# Patient Record
Sex: Male | Born: 1950
Health system: Southern US, Community
[De-identification: ages and names within clinical notes are randomized; demographics above are authoritative.]

---

## 2007-04-23 ENCOUNTER — Ambulatory Visit (HOSPITAL_COMMUNITY): Admission: RE | Admit: 2007-04-23 | Discharge: 2007-04-23 | Payer: Self-pay | Admitting: *Deleted

## 2007-04-23 ENCOUNTER — Encounter (INDEPENDENT_AMBULATORY_CARE_PROVIDER_SITE_OTHER): Payer: Self-pay | Admitting: *Deleted

## 2009-10-12 ENCOUNTER — Encounter: Admission: RE | Admit: 2009-10-12 | Discharge: 2009-10-12 | Payer: Self-pay | Admitting: Internal Medicine

## 2010-12-21 NOTE — Op Note (Signed)
NAME:  Brandon Jarvis, Brandon Jarvis NO.:  [id number]   MEDICAL RECORD NO.:  [id number]          PATIENT TYPE:  AMB   LOCATION:  ENDO                         FACILITY:  [facility]   PHYSICIAN:  Georgiana Spinner, M.D.    DATE OF BIRTH:  [birth year]   DATE OF PROCEDURE:  04/23/2007  DATE OF DISCHARGE:                               OPERATIVE REPORT   PROCEDURE:  Colonoscopy.   INDICATIONS:  Colon polyps.   ANESTHESIA:  Demerol 75 mg, Versed 7 mg.   DESCRIPTION OF PROCEDURE:  With the patient mildly sedated in the left  lateral decubitus position, the Pentax videoscopic colonoscope was  inserted into the rectum after normal rectal exam, and passed under  direct vision to the cecum, identified by ileocecal valve and base of  cecum both of which were photographed.  From this point the colonoscope  was slowly withdrawn taking circumferential views of colonic mucosa  stopping in the descending colon where a polyp was seen, photographed,  and removed using snare cautery technique at a setting of 20/150 blended  current.   We next stopped a 25 cm from anal verge where two polyps were seen. One  was removed using hot biopsy forceps technique.  The next using snare  cautery technique.  Both at the same setting of 20/150 blended current.  We stopped in the rectum which appeared normal.  On direct it showed  hemorrhoids in retroflex view.  The endoscope was straightened and  withdrawn.  The patient's vital signs and pulse oximeter remained  stable.  The patient tolerated the procedure well without apparent  complications.   FINDINGS:  1. Slightly suboptimal prep in the cecum, brownish material covering      the mucosa which would not wash with water.  2. Polyps of descending colon at 25 cm from the anal verge.  3. Internal hemorrhoids.   PLAN:  Await biopsy report.  The patient will call me for results and  follow up with me as an outpatient.           ______________________________  Georgiana Spinner, M.D.     GMO/MEDQ  D:  04/23/2007  T:  04/23/2007  Job:  [id number]

## 2011-06-10 ENCOUNTER — Encounter (HOSPITAL_COMMUNITY): Payer: Self-pay | Admitting: Pharmacy Technician

## 2011-06-14 ENCOUNTER — Ambulatory Visit (HOSPITAL_COMMUNITY)
Admission: RE | Admit: 2011-06-14 | Discharge: 2011-06-14 | Disposition: A | Discharge status: Home / Self Care | Payer: BC Managed Care – PPO | Source: Ambulatory Visit | Attending: Cardiology | Admitting: Cardiology

## 2011-06-14 ENCOUNTER — Encounter (HOSPITAL_COMMUNITY)
Admission: RE | Disposition: A | Discharge status: Home / Self Care | Payer: Self-pay | Source: Ambulatory Visit | Attending: Cardiology

## 2011-06-14 DIAGNOSIS — I6529 Occlusion and stenosis of unspecified carotid artery: Secondary | ICD-10-CM | POA: Insufficient documentation

## 2011-06-14 DIAGNOSIS — E785 Hyperlipidemia, unspecified: Secondary | ICD-10-CM | POA: Insufficient documentation

## 2011-06-14 DIAGNOSIS — R079 Chest pain, unspecified: Secondary | ICD-10-CM | POA: Insufficient documentation

## 2011-06-14 DIAGNOSIS — R0609 Other forms of dyspnea: Secondary | ICD-10-CM | POA: Insufficient documentation

## 2011-06-14 DIAGNOSIS — R0989 Other specified symptoms and signs involving the circulatory and respiratory systems: Secondary | ICD-10-CM | POA: Insufficient documentation

## 2011-06-14 DIAGNOSIS — I708 Atherosclerosis of other arteries: Secondary | ICD-10-CM | POA: Insufficient documentation

## 2011-06-14 DIAGNOSIS — I70219 Atherosclerosis of native arteries of extremities with intermittent claudication, unspecified extremity: Secondary | ICD-10-CM | POA: Insufficient documentation

## 2011-06-14 HISTORY — PX: LEFT HEART CATHETERIZATION WITH CORONARY ANGIOGRAM: SHX5451

## 2011-06-14 HISTORY — PX: LOWER EXTREMITY ANGIOGRAM: SHX5508

## 2011-06-14 SURGERY — LEFT HEART CATHETERIZATION WITH CORONARY ANGIOGRAM
Anesthesia: LOCAL

## 2011-06-14 MED ORDER — ASPIRIN 81 MG PO CHEW
CHEWABLE_TABLET | ORAL | Status: AC
  Filled 2011-06-14: qty 4

## 2011-06-14 MED ORDER — SODIUM CHLORIDE 0.9 % IV SOLN
INTRAVENOUS | Status: DC
  Administered 2011-06-14: 09:00:00 via INTRAVENOUS

## 2011-06-14 MED ORDER — ONDANSETRON HCL 4 MG/2ML IJ SOLN: 4.0000 mg | Freq: Four times a day (QID) | INTRAMUSCULAR | Status: DC | PRN

## 2011-06-14 MED ORDER — MIDAZOLAM HCL 2 MG/2ML IJ SOLN
INTRAMUSCULAR | Status: AC
  Filled 2011-06-14: qty 2

## 2011-06-14 MED ORDER — HYDROMORPHONE HCL PF 2 MG/ML IJ SOLN
INTRAMUSCULAR | Status: AC
  Filled 2011-06-14: qty 1

## 2011-06-14 MED ORDER — HEPARIN (PORCINE) IN NACL 2-0.9 UNIT/ML-% IJ SOLN
INTRAMUSCULAR | Status: AC
  Filled 2011-06-14: qty 2000

## 2011-06-14 MED ORDER — NITROGLYCERIN 0.2 MG/ML ON CALL CATH LAB
INTRAVENOUS | Status: AC
  Filled 2011-06-14: qty 1

## 2011-06-14 MED ORDER — ACETAMINOPHEN 325 MG PO TABS: 650.0000 mg | ORAL_TABLET | ORAL | Status: DC | PRN

## 2011-06-14 MED ORDER — SODIUM CHLORIDE 0.9 % IV SOLN: 1.0000 mL/kg/h | INTRAVENOUS | Status: DC

## 2011-06-14 MED ORDER — ASPIRIN 81 MG PO CHEW
324.0000 mg | CHEWABLE_TABLET | ORAL | Status: DC
  Administered 2011-06-14: 324 mg via ORAL

## 2011-06-14 NOTE — Op Note (Signed)
Dictated

## 2011-06-14 NOTE — H&P (Signed)
EVAL to f/u gxt per JG. dks (Dr Lavone Orn)  (Appt time: 11:00 AM) (Arrival time: 10:59 AM)  HOPI: Patient presents to me for followup and evaluation of an abnormal MRI that was done in March of 2011.  This was done for abdominal discomfort and nephrology assess.  At that time she was found to have significant atherosclerotic changes of the abdominal aorta. Patient was referred to [place] for a routine treadmill exercise stress test as a part of screening because of his abdominal aortic atherosclerosis.  During the stress test patient had submaximal exercise and had to be stopped due to what appeared to be symptoms of claudication in bilateral calf left worse than the right.  He also had mild dyspnea and very mild chest discomfort.  The EKG changes for past 2 to suggest myocardial ischemia.  He is now presents to the office for further followup and evaluation. Patient has been walking on a daily basis.  He walks about in her from one to 2 miles a day.  He states that he does not walk on an incline as on a treadmill.  At times he notices his hips both right and left bothersome left more than the right and also his calf do bother him and sometimes he does slow down but overall he states that he has remained fairly active in the last several years.  He occasionally when he does more than his usual amount of work does feel a little short of breath and also notices a little chest discomfort.  Otherwise he states he's feeling well.  He denies PND orthopnea.  Denies any palpitations.  He denies any syncope.  He denies neurological deficits.   ROS: Arthritis-no;  Cramping of the legs and feet at night or with activity-yes, with a lot of activity; Diabetes-no;  Hypothyroidism-no;  Previous Stroke-no, Previous GI Bleed-no ,  Recent weight change-no . Other systems negative.   Past Medical History (Major events, hospitalizations, surgeries):  Septasemia from a cat bite 1992. Treadmill stress test 06/07/11. MRI abdomen/chest  3/11.  Known allergies: NKDA.     Ongoing medical problems: Gum disease. Bursitis in elbows. Atherosclerosis.    Family medical history: Mother died at age 82 from septic pneumonia; h/o arrythmia. Father died at age 72 from Parkinson's/dementia; h/o triple bypass in late 36's. 2 sisters-Sister died at age 61 from colon cancer; Living sister is healthy.    Preventative: Colonoscopy 3/11 (normal). No endoscopy.    Social history: No smoking-quit 14 months ago. No alcohol. Married. No children. Poly substance abuse and clean for 17 years.  Height 72 ", weight 210 Lbs, BMI 28.5, BP 120/84, Pulse 53/min.  Physical exam Well build and overweight body habitus who is  in no acute distress. Appears stated age. Alert Ox3.  There is no cyanosis.  NECK: no JVD noted.  Heart: S1 S2 Normal, S4 gallop present. No murmur.  CHEST EXAM: No tenderness of chest wall.  LUNGS: Clear to percuss and auscultate. No rales or ronchi.  ABDOMEN: No hepatosplenomegaly.  BS normal in all 4 quadrants. Abdomen is non-tender.  MUSCULOSKELETAL EXAM: Intact with full range of motion in all 4 extremities.  NEUROLOGIC EXAM: Grossly intact without any focal deficits. Alert O x 3.   EXTREMITY: No edema. Normal pulses.  VASCULAR EXAM: No skin breakdown. Carotids normal. Extremities: Femoral pulse left femoral bruit present 1-2 plus pulse, right normal. Popliteal pulse Normal right and left ; Pedal pulse normal. No prominent pulse felt in the abdomen.  No varicose veins.  Assessment: 1.  PAD/Claudication:.   At times he notices his hips both right and left bothersome left more than the right. Also symptoms of claudication in bilateral calf left worse than the right.  2. Chest pain suggestive of angina pectoris. Chest pain similar to the discomfort prior to coronary revascularization.    Patient had a treadmill exercise stress test in which he suddenly stopped due to hip and calf pain, shortness of breath and mild chest discomfort. Stress EKG  06/07/11: Positive for ischemia. Dyspnea, chest pain, claudication right calf. Submaximal heart rate due to patient stopping due to claudicaiton. 3.  Shortness of breath/Dyspnea on exertion. Due to mild COPD.  Anginal equivalent  cannot be excluded.   Echo: 06/08/11: Left ventricular cavity is normal in size.   Normal diastolic filling.  Normal systolic global function. EF 55%. Right atrial cavity is normal in size.   Mild aneurysmal motion of the interatrial septum.  There is a prominent Chiari network (normal variant). 4. Poly substance abuse and clean for 17 years.  5. Hyperlipidemia mild.    Labsa 05/11/11 CBC was within normal limits except for mild thrombocytopenia with platelet count of 118,000.  Electrolytes were within normal limits with bun 16, S creatinine 0.9.  Liver enzymes were within normal limits. Total cholesterol is 214, triglycerides 130, HDL 62, LDL 126 and non-HDL cholesterol is 152.  DIAGNOSES:       Other nonspecific abnormal function study of cardiovascular system [794.39]       Shortness of breath [786.05]       Precordial pain [786.51]       Atherosclerosis of native arteries of the extremities with intermittent claudication [440.21]       Pure hypercholesterolemia [272.0]  Plan:  Mechanism of underlying disease process and action of medications discussed with the patient.   I discussed primary/secondary prevention and also dietary counceling was done. Continue current medications unchanged.  I have extensive discussion with the patient regarding medical therapy for a abnormal stress test and symptoms that suggest both angina pectoris and probable claudication.  His lipid profile and labs were also discussed with the patient. Schedule for cardiac catheterization. We discussed regarding risks, benefits, alternatives to this including stress testing, CTA and continued medical therapy. Patient wants to proceed. Understands <1-2% risk of death, stroke, MI, urgent CABG, bleeding,  infection, renal failure but not limited to these.   I will also perform abdominal aortogram to evaluate specifically for left   iliac artery stenosis. If present will probably proceed with PTA and angioplasty of the same unless CAD is significant. Patient understands the risks, benefits as stated above for this. Return visit: I will make further recommendations after the test results are known.  REFERRALS:       Juline Patch (Internal Medicine)  Fax: [phone]  Phone: [phone]  MEDICATIONS:       Doxycycline Hyclate (doxycycline) oral delayed release tablet hyclate 100 mg Take 1/4 tab daily                   prescription:   not prescribed this visit       Aspirin oral tablet 81 mg once a day                   prescription:   not prescribed this visit       Fish oil  1200 mg- 2 caps daily  prescription:   not prescribed this visit       Lysine oral tablet 1000 mg once a day                   prescription:   not prescribed this visit       Coenzyme Co Q10 (ubiquinone) oral capsule 100 mg once a day                   prescription:   not prescribed this visit       ICaps with Lutein and Zeaxan (multivitamin with minerals) oral tablet Antioxidant Multiple Vitamins and Minerals once a day                   prescription:   not prescribed this visit       Multivitamin oral tablet Multiple Vitamins once a day                   prescription:   not prescribed this visit

## 2011-06-14 NOTE — Brief Op Note (Addendum)
06/14/2011  9:52 AM  PATIENT:  Brandon Jarvis  60 y.o. male  PRE-OPERATIVE DIAGNOSIS:  chest pain  POST-OPERATIVE DIAGNOSIS:  * PAD *  PROCEDURE:  Procedure(s): LEFT HEART CATHETERIZATION WITH CORONARY ANGIOGRAM ABDOMINAL ANGIOGRAM  SURGEON:  Surgeon(s): Pamella Pert  PHYSICIAN ASSISTANT:   ASSISTANTS: none   ANESTHESIA:   IV sedation  EBL:     BLOOD ADMINISTERED:none  DRAINS: none   LOCAL MEDICATIONS USED:  LIDOCAINE 10CC  SPECIMEN:  No Specimen  DISPOSITION OF SPECIMEN:  N/A  COUNTS:  YES  TOURNIQUET:  * No tourniquets in log *  DICTATION: .Other Dictation: Dictation Number [id number], [id number]  PLAN OF CARE: discharge home after bedrest  PATIENT DISPOSITION:  Short Stay   Delay start of Pharmacological VTE agent (>24hrs) due to surgical blood loss or risk of bleeding:  not applicable        Diagnostic left heart cath: Mild calcific plaque in LM, otherwise normal. Left Internal iliac artery stenosis of 90%. Mild 30% EIA stenosis. Medical therapy

## 2011-06-14 NOTE — Cardiovascular Report (Signed)
NAME:  Brandon Jarvis, Brandon Jarvis NO.:  [id number]  MEDICAL RECORD NO.:  [id number]  LOCATION:  [facility]                         FACILITY:  [facility]  PHYSICIAN:  Pamella Pert, MD DATE OF BIRTH:  1951-05-01  DATE OF PROCEDURE:  06/14/2011 DATE OF DISCHARGE:                           CARDIAC CATHETERIZATION   ADDENDUM:  TECHNIQUE OF PROCEDURE:  Under sterile precautions using a 6-French right femoral artery access, 6-French multipurpose B2 catheter was advanced into the right then into the left ventricle.  Left ventriculography was performed both in LAO and RAO projection.  Catheter pulled into the ascending aorta.  Right coronary was selectively engaged and angiography was performed.  Then the left main coronary artery was selectively engaged and angiography was performed.  Catheter was then pulled back into the abdominal aorta and abdominal aortogram was performed.  Left iliac arteriogram with distal runoff:  Left iliac artery with distal runoff was performed using a 5-French crossover catheter and left iliac artery was selectively cannulated and angiography was performed. The catheter was then pulled out of body in the usual fashion.  The patient tolerated the procedure.  No immediate complication noted. Hemostasis was obtained by applying manual pressure.     Pamella Pert, MD     JRG/MEDQ  D:  06/14/2011  T:  06/14/2011  Job:  [id number]

## 2011-06-14 NOTE — Cardiovascular Report (Signed)
NAME:  Brandon Jarvis, Brandon Jarvis NO.:  [id number]  MEDICAL RECORD NO.:  [id number]  LOCATION:  [facility]                         FACILITY:  [facility]  PHYSICIAN:  Pamella Pert, MD DATE OF BIRTH:  05/29/1951  DATE OF PROCEDURE: DATE OF DISCHARGE:                           CARDIAC CATHETERIZATION   PROCEDURE PERFORMED: 1. Left ventriculography. 2. Selective right and left coronary angiography. 3. Abdominal aortogram. 4. Crossover into the left iliac artery and left iliac arteriogram. 5. Left iliac arteriogram with femoral runoff.  INDICATION:  Brandon Jarvis is a pleasant 60 year old gentleman who has history of prior tobacco use, polysubstance abuse, who presents for evaluation of abnormal MRI that had revealed significant abdominal aortic atherosclerosis.  He underwent a treadmill exercise stress test which was abnormal and positive for myocardial ischemia with associated shortness of breath and chest discomfort.  Given this, he was brought to the cardiac cath lab to evaluate his coronary anatomy.  Because of abnormal physical examination including bruit and left thigh claudication, he was also brought for peripheral angiography with an eye towards angioplasty.  HEMODYNAMIC DATA:  The left ventricular pressure was 114/4 with end- diastolic of 9 mmHg.  Aortic pressure was 118/68 with a mean of 91 mmHg. There is no pressure gradient across the aortic valve.  ANGIOGRAPHIC DATA:  Left ventricle.  Left ventricular systolic function was lower limit of normal with ejection fraction of 50-55%.  There was no significant mitral regurgitation.  Right coronary artery.  Right coronary artery is large caliber vessel. Smooth and normal.  It is dominant.  Left main coronary artery.  Left main coronary artery shows mild calcification but no luminal irregularity.  LAD.  LAD is a large caliber vessel.  It is smooth and normal.  Left circumflex.  The circumflex is large caliber  vessel.  It is smooth and normal.  Abdominal aortogram.  Abdominal aortogram revealed presence of 2 renal arteries, 1 on either side.  They are widely patent.  Left renal artery appeared to have stenosis.  Selective left renal arteriography. Selective left renal arteriography revealed widely patent left renal artery.  Abdominal aortogram and left iliac arteriogram with distal runoff. Distal abdominal aortogram with aortoiliac bifurcation to be widely patent.  Left external iliac artery showed 50% stenosis.  Selective left iliac arteriogram with femoral runoff.  Selective left iliac arteriogram with femoral runoff revealed left internal iliac artery to have a 90% stenosis.  Left external iliac artery after the bifurcation of the internal iliac artery showed a 30-40% stenosis.  The left superficial femoral artery and below the knee, there was 3- vessel runoff was evident without any significant peripheral arterial disease.  IMPRESSION: 1. No significant coronary arteries by cardiac catheterization.  Mild     calcification is evident in the left main. 2. Abdominal aortogram revealed no evidence of abdominal aortic     aneurysm or abdominal aortic stenosis. 3. Left internal iliac artery stenosis of 90% and left external iliac     stenosis of 30%.  Otherwise no significant peripheral artery     disease.  RECOMMENDATION:  Continued medical therapy is indicated with risk modification.  The patient will be discharged home today with  outpatient followup.     Pamella Pert, MD     JRG/MEDQ  D:  06/14/2011  T:  06/14/2011  Job:  [id number]  cc:   Juanda Crumble

## 2011-06-17 ENCOUNTER — Encounter (HOSPITAL_COMMUNITY): Payer: Self-pay

## 2014-07-16 ENCOUNTER — Encounter (HOSPITAL_COMMUNITY): Payer: Self-pay | Admitting: Cardiology

## 2015-02-23 ENCOUNTER — Other Ambulatory Visit: Payer: Self-pay | Admitting: Gastroenterology

## 2015-06-08 ENCOUNTER — Other Ambulatory Visit: Payer: Self-pay | Admitting: Internal Medicine

## 2015-06-08 DIAGNOSIS — Z87891 Personal history of nicotine dependence: Secondary | ICD-10-CM

## 2015-06-12 ENCOUNTER — Ambulatory Visit
Admission: RE | Admit: 2015-06-12 | Discharge: 2015-06-12 | Disposition: A | Discharge status: Home / Self Care | Payer: BLUE CROSS/BLUE SHIELD | Source: Ambulatory Visit | Attending: Internal Medicine | Admitting: Internal Medicine

## 2015-06-12 DIAGNOSIS — Z87891 Personal history of nicotine dependence: Secondary | ICD-10-CM

## 2015-07-13 ENCOUNTER — Other Ambulatory Visit: Payer: Self-pay | Admitting: Internal Medicine

## 2015-07-13 DIAGNOSIS — E278 Other specified disorders of adrenal gland: Secondary | ICD-10-CM

## 2015-07-13 DIAGNOSIS — E279 Disorder of adrenal gland, unspecified: Principal | ICD-10-CM

## 2015-07-17 ENCOUNTER — Ambulatory Visit
Admission: RE | Admit: 2015-07-17 | Discharge: 2015-07-17 | Disposition: A | Discharge status: Home / Self Care | Payer: BLUE CROSS/BLUE SHIELD | Source: Ambulatory Visit | Attending: Internal Medicine | Admitting: Internal Medicine

## 2015-07-17 DIAGNOSIS — E279 Disorder of adrenal gland, unspecified: Principal | ICD-10-CM

## 2015-07-17 DIAGNOSIS — E278 Other specified disorders of adrenal gland: Secondary | ICD-10-CM

## 2015-08-18 ENCOUNTER — Encounter: Payer: Self-pay | Admitting: Diagnostic Neuroimaging

## 2015-08-18 ENCOUNTER — Ambulatory Visit (INDEPENDENT_AMBULATORY_CARE_PROVIDER_SITE_OTHER): Payer: BLUE CROSS/BLUE SHIELD | Admitting: Diagnostic Neuroimaging

## 2015-08-18 VITALS — BP 142/81 | HR 55 | Ht 72.0 in | Wt 207.0 lb

## 2015-08-18 DIAGNOSIS — M62838 Other muscle spasm: Secondary | ICD-10-CM | POA: Diagnosis not present

## 2015-08-18 DIAGNOSIS — R413 Other amnesia: Secondary | ICD-10-CM

## 2015-08-18 DIAGNOSIS — M791 Myalgia, unspecified site: Secondary | ICD-10-CM

## 2015-08-18 DIAGNOSIS — M792 Neuralgia and neuritis, unspecified: Secondary | ICD-10-CM | POA: Diagnosis not present

## 2015-08-18 NOTE — Progress Notes (Signed)
[facility]  PATIENT: Brandon Jarvis DOB: 02-25-1951  REFERRING CLINICIAN: Barbette Hair HISTORY FROM: patient  REASON FOR VISIT: new consult    HISTORICAL  CHIEF COMPLAINT:  Chief Complaint  Patient presents with  . Pain    rm 7, New Pt, muscle/joint aches, foggy head since 03/2015    HISTORY OF PRESENT ILLNESS:   65 year old right-handed male here for evaluation of constellation of symptoms including muscle aches and pain, joint pains, shooting pains, foggy headedness, easy bruising, decreased coordination since August 2016.  Patient has had some mild intermittent joint pains throughout his adult life but nothing severe enough to warrant medical attention. August 2016 he had an increase in muscle aches, burning sensation, joint pains, joint aches, which seem to affect right or left side, arms or legs, in a random fashion. Symptoms occur almost on a daily basis now. When he is active and doing work, this seems to distract him from his symptoms and he does not feel significant problems. When he is at rest he notices the symptoms more. Activity does not seem to aggravate the problem.  In addition patient having some intermittent electrical shooting pains in his feet, calves, legs.  In addition patient also having a "foggy headed sensation" with difficulty with concentration, short-term memory and attention. Patient is particularly concerned given his family history of Parkinson's disease and dementia in his father.  No specific triggering or aggravating factors. No recent traumas, accidents, injuries. No change in diet, exercise, activity, stress.  Patient has tried hydrocodone and morphine from his wife's supply to see if this would help and it does not given him any benefit in symptom control.  Patient is seen PCP, rheumatology, had extensive blood work testing without specific diagnosis.   REVIEW OF SYSTEMS: Full 14 system review of systems performed and notable  only for joint pain aching muscles and microhematuria. He sleeps 6-7 hours at night. Denies any snoring or breathing problems.   ALLERGIES: No Known Allergies  HOME MEDICATIONS: Outpatient Prescriptions Prior to Visit  Medication Sig Dispense Refill  . aspirin EC 81 MG tablet Take 81 mg by mouth daily. Reported on 08/18/2015    . CO ENZYME Q-10 PO Take 1 capsule by mouth daily. Reported on 08/18/2015    . doxycycline (VIBRA-TABS) 100 MG tablet Take 25 mg by mouth daily. Reported on 08/18/2015    . LUTEIN-ZEAXANTHIN PO Take 1 tablet by mouth daily. Reported on 08/18/2015    . Multiple Vitamins-Minerals (MULTIVITAMINS THER. W/MINERALS) TABS Take 1 tablet by mouth daily. Reported on 08/18/2015    . Omega-3 Fatty Acids (FISH OIL) 1200 MG CAPS Take 2 capsules by mouth daily. Reported on 08/18/2015     No facility-administered medications prior to visit.    PAST MEDICAL HISTORY: History reviewed. No pertinent past medical history.  PAST SURGICAL HISTORY: Past Surgical History  Procedure Laterality Date  . Left heart catheterization with coronary angiogram N/A 06/14/2011    Procedure: LEFT HEART CATHETERIZATION WITH CORONARY ANGIOGRAM;  Surgeon: Laverda Page, MD;  Location: [facility] CATH LAB;  Service: Cardiovascular;  Laterality: N/A;  . Lower extremity angiogram Left 06/14/2011    Procedure: LOWER EXTREMITY ANGIOGRAM;  Surgeon: Laverda Page, MD;  Location: [facility] CATH LAB;  Service: Cardiovascular;  Laterality: Left;    FAMILY HISTORY: History reviewed. No pertinent family history.  SOCIAL HISTORY:  Social History   Social History  . Marital Status: Married    Spouse Name: N/A  . Number of Children:  0  . Years of Education: 66   Occupational History  .      retired, Engineer, production   Social History Main Topics  . Smoking status: Former Smoker    Quit date: 08/17/2009  . Smokeless tobacco: Not on file  . Alcohol Use: No     Comment: quit 1997  . Drug Use: No      Comment: quit 1997  . Sexual Activity: Not on file   Other Topics Concern  . Not on file   Social History Narrative   Lives at home with spouse   Caffeine use- coffee 3 cups daily     PHYSICAL EXAM  GENERAL EXAM/CONSTITUTIONAL: Vitals:  Filed Vitals:   08/18/15 1123  BP: 142/81  Pulse: 55  Height: 6' (1.829 m)  Weight: 207 lb (93.895 kg)     Body mass index is 28.07 kg/(m^2).  Visual Acuity Screening   Right eye Left eye Both eyes  Without correction:     With correction: 20/30 20/30      Patient is in no distress; well developed, nourished and groomed; neck is supple  CARDIOVASCULAR:  Examination of carotid arteries is normal; no carotid bruits  Regular rate and rhythm, no murmurs  Examination of peripheral vascular system by observation and palpation is normal  EYES:  Ophthalmoscopic exam of optic discs and posterior segments is normal; no papilledema or hemorrhages  MUSCULOSKELETAL:  Gait, strength, tone, movements noted in Neurologic exam below  NEUROLOGIC: MENTAL STATUS:  No flowsheet data found.  awake, alert, oriented to person, place and time  recent and remote memory intact  normal attention and concentration  language fluent, comprehension intact, naming intact,   fund of knowledge appropriate  CRANIAL NERVE:   2nd - no papilledema on fundoscopic exam  2nd, 3rd, 4th, 6th - pupils equal and reactive to light, visual fields full to confrontation, extraocular muscles intact, no nystagmus  5th - facial sensation symmetric  7th - facial strength symmetric  8th - hearing intact  9th - palate elevates symmetrically, uvula midline  11th - shoulder shrug symmetric  12th - tongue protrusion midline  MOTOR:   normal bulk and tone, full strength in the BUE, BLE  SENSORY:   normal and symmetric to light touch, temperature, vibration; DECR LEFT FOOT SENSATION TO ALL MODALITIES  COORDINATION:   finger-nose-finger, fine finger  movements normal  REFLEXES:   deep tendon reflexes TRACE and symmetric  GAIT/STATION:   narrow based gait; able to walk tandem; romberg is negative    DIAGNOSTIC DATA (LABS, IMAGING, TESTING) - I reviewed patient records, labs, notes, testing and imaging myself where available.  No results found for: WBC, HGB, HCT, MCV, PLT No results found for: NA, K, CL, CO2, GLUCOSE, BUN, CREATININE, CALCIUM, PROT, ALBUMIN, AST, ALT, ALKPHOS, BILITOT, GFRNONAA, GFRAA No results found for: CHOL, HDL, LDLCALC, LDLDIRECT, TRIG, CHOLHDL No results found for: HGBA1C No results found for: VITAMINB12 No results found for: TSH  07/17/15 CT abdomen  1. 15 mm left adrenal nodule is stable from 2011 consistent with benign process such as adenoma. 2. Left nephrolithiasis.  06/12/15 CT chest  - Lung-RADS Category 2, benign appearance or behavior. Continue annual screening with low-dose chest CT without contrast in 12 months. - Coronary artery atherosclerosis. - Emphysema.    ASSESSMENT AND PLAN  65 y.o. year old male here with constellation of symptoms including muscle aches and pain, joint pains, shooting pains, foggy headedness, easy bruising, decreased coordination since August 2016.  Overall suspect peripheral etiology of muscle aches and shooting pain, such as neuropathy or myopathy/myalgia. His memory problems may be secondary to his pain issues. We'll start with EMG nerve conduction study. Then may pursue additional testing based on those results.  After neuromuscular evaluation, we may consider MRI brain and sleep study for further evaluation of his memory difficulties.  We considered medication management of his symptoms but he would like to decline at this time.   Ddx: neuropathy, myopathy, myalgia, musculoskeletal  Muscle spasm  Muscle ache  Neuropathic pain  Memory loss    PLAN: - EMG/NCS; then may consider add'l labs and muscle/nerve biopsy - then may consider MRI brain and  sleep study for memory evaluation - may consider gabapentin, NSAIDs, muscle relaxers in future for symptom control  Orders Placed This Encounter  Procedures  . NCV with EMG(electromyography)   Return in about 6 weeks (around 09/29/2015).    Penni Bombard, MD [id number], [id number] AM Certified in Neurology, Neurophysiology and Neuroimaging  [facility] [address] [place], [place] [address] [phone]

## 2015-08-18 NOTE — Patient Instructions (Signed)
Thank you for coming to see [place] at [facility]. I hope we have been able to provide you high quality care today.  You may receive a patient satisfaction survey over the next few weeks. We would appreciate your feedback and comments so that we may continue to improve ourselves and the health of our patients.  - I will check electrical nerve testing (EMG/NCS)   ~~~~~~~~~~~~~~~~~~~~~~~~~~~~~~~~~~~~~~~~~~~~~~~~~~~~~~~~~~~~~~~~~  DR. Telisa Ohlsen'S GUIDE TO HAPPY AND HEALTHY LIVING These are some of my general health and wellness recommendations. Some of them may apply to you better than others. Please use common sense as you try these suggestions and feel free to ask me any questions.   ACTIVITY/FITNESS Mental, social, emotional and physical stimulation are very important for brain and body health. Try learning a new activity (arts, music, language, sports, games).  Keep moving your body to the best of your abilities. You can do this at home, inside or outside, the park, community center, gym or anywhere you like. Consider a physical therapist or personal trainer to get started. Consider the app Sworkit. Fitness trackers such as smart-watches, smart-phones or Fitbits can help as well.   NUTRITION Eat more plants: colorful vegetables, nuts, seeds and berries.  Eat less sugar, salt, preservatives and processed foods.  Avoid toxins such as cigarettes and alcohol.  Drink water when you are thirsty. Warm water with a slice of lemon is an excellent morning drink to start the day.  Consider these websites for more information The Nutrition Source (https://www.henry-hernandez.biz/) Precision Nutrition (WindowBlog.ch)   RELAXATION Consider practicing mindfulness meditation or other relaxation techniques such as deep breathing, prayer, yoga, tai chi, massage. See website mindful.org or the apps Headspace or Calm to help get  started.   SLEEP Try to get at least 7-8+ hours sleep per day. Regular exercise and reduced caffeine will help you sleep better. Practice good sleep hygeine techniques. See website sleep.org for more information.   PLANNING Prepare estate planning, living will, healthcare POA documents. Sometimes this is best planned with the help of an attorney. Theconversationproject.org and agingwithdignity.org are excellent resources.

## 2015-08-21 ENCOUNTER — Ambulatory Visit (INDEPENDENT_AMBULATORY_CARE_PROVIDER_SITE_OTHER): Payer: BLUE CROSS/BLUE SHIELD | Admitting: Diagnostic Neuroimaging

## 2015-08-21 ENCOUNTER — Encounter (INDEPENDENT_AMBULATORY_CARE_PROVIDER_SITE_OTHER): Payer: Self-pay | Admitting: Diagnostic Neuroimaging

## 2015-08-21 DIAGNOSIS — M791 Myalgia, unspecified site: Secondary | ICD-10-CM

## 2015-08-21 DIAGNOSIS — M62838 Other muscle spasm: Secondary | ICD-10-CM | POA: Diagnosis not present

## 2015-08-21 DIAGNOSIS — Z0289 Encounter for other administrative examinations: Secondary | ICD-10-CM

## 2015-08-21 DIAGNOSIS — M792 Neuralgia and neuritis, unspecified: Secondary | ICD-10-CM

## 2015-08-21 NOTE — Procedures (Signed)
   [facility]  NCS (NERVE CONDUCTION STUDY) WITH EMG (ELECTROMYOGRAPHY) REPORT   STUDY DATE: 08/21/15 PATIENT NAME: Brandon Jarvis DOB: 11-Mar-1951 MRN: [id number]  ORDERING CLINICIAN: Andrey Spearman, MD   TECHNOLOGIST: Laretta Alstrom  ELECTROMYOGRAPHER: Earlean Polka. Rhina Kramme, MD  CLINICAL INFORMATION: 66 year old male with migratory pain and numbness.  FINDINGS: NERVE CONDUCTION STUDY: Bilateral median, ulnar, peroneal, tibial motor responses and F wave latencies are normal. Bilateral median, ulnar, peroneal sensory responses are normal.  NEEDLE ELECTROMYOGRAPHY: Needle examination of right upper extremity, deltoid, biceps, triceps, flexor carpi radialis, first dorsal interosseous muscles is normal.   IMPRESSION: Normal study. No electrodiagnostic evidence of large fiber neuropathy or myopathy at this time.    INTERPRETING PHYSICIAN:  Penni Bombard, MD Certified in Neurology, Neurophysiology and Neuroimaging  [facility] [address], [place], [place] [address] [phone]

## 2015-10-02 ENCOUNTER — Encounter: Payer: Self-pay | Admitting: Diagnostic Neuroimaging

## 2015-10-02 ENCOUNTER — Ambulatory Visit (INDEPENDENT_AMBULATORY_CARE_PROVIDER_SITE_OTHER): Payer: BLUE CROSS/BLUE SHIELD | Admitting: Diagnostic Neuroimaging

## 2015-10-02 VITALS — BP 119/72 | HR 70 | Ht 72.0 in | Wt 207.8 lb

## 2015-10-02 DIAGNOSIS — R413 Other amnesia: Secondary | ICD-10-CM | POA: Diagnosis not present

## 2015-10-02 DIAGNOSIS — G890 Central pain syndrome: Secondary | ICD-10-CM | POA: Diagnosis not present

## 2015-10-02 MED ORDER — CYCLOBENZAPRINE HCL 10 MG PO TABS
10.0000 mg | ORAL_TABLET | Freq: Three times a day (TID) | ORAL | Status: DC | PRN
Start: 2015-10-02 — End: 2016-01-01

## 2015-10-02 MED ORDER — DULOXETINE HCL 30 MG PO CPEP: 30.0000 mg | ORAL_CAPSULE | Freq: Every day | ORAL | Status: DC

## 2015-10-02 NOTE — Patient Instructions (Signed)
-   try duloxetine 30mg  daily; after 2 weeks may increase to 60mg  daily - try cyclobenzaprine 10mg  as needed for muscle pain/spasm - I will check MRI brain

## 2015-10-02 NOTE — Progress Notes (Signed)
[facility]  PATIENT: Brandon Jarvis DOB: Jul 07, 1951  REFERRING CLINICIAN: Barbette Hair HISTORY FROM: patient and wife REASON FOR VISIT: follow up   HISTORICAL  CHIEF COMPLAINT:  Chief Complaint  Patient presents with  . Muscle spasm    rm 7, wife DeVanuia, "pain is not better, maybe even worse; legs/arms, shooting pains, constant"  . Follow-up    6 week  . Muscle Pain    HISTORY OF PRESENT ILLNESS:   UPDATE 10/02/15: Pain and memory loss continues. More agitation. Tried ibuprofen, tramadol, tylenol, hydrocodone, without relief.  PRIOR HPI (08/18/15): 65 year old right-handed male here for evaluation of constellation of symptoms including muscle aches and pain, joint pains, shooting pains, foggy headedness, easy bruising, decreased coordination since August 2016. Patient has had some mild intermittent joint pains throughout his adult life but nothing severe enough to warrant medical attention. August 2016 he had an increase in muscle aches, burning sensation, joint pains, joint aches, which seem to affect right or left side, arms or legs, in a random fashion. Symptoms occur almost on a daily basis now. When he is active and doing work, this seems to distract him from his symptoms and he does not feel significant problems. When he is at rest he notices the symptoms more. Activity does not seem to aggravate the problem. In addition patient having some intermittent electrical shooting pains in his feet, calves, legs. In addition patient also having a "foggy headed sensation" with difficulty with concentration, short-term memory and attention. Patient is particularly concerned given his family history of Parkinson's disease and dementia in his father. No specific triggering or aggravating factors. No recent traumas, accidents, injuries. No change in diet, exercise, activity, stress. Patient has tried hydrocodone and morphine from his wife's supply to see if this would help and it  does not given him any benefit in symptom control. Patient is seen PCP, rheumatology, had extensive blood work testing without specific diagnosis.   REVIEW OF SYSTEMS: Full 14 system review of systems performed and notable only for joint pain aching muscles and microhematuria. He sleeps 6-7 hours at night. Denies any snoring or breathing problems.   ALLERGIES: No Known Allergies  HOME MEDICATIONS: Outpatient Prescriptions Prior to Visit  Medication Sig Dispense Refill  . aspirin EC 81 MG tablet Take 81 mg by mouth daily. Reported on 08/18/2015    . UNABLE TO FIND Med Name: bacopa complex twice daily    . UNABLE TO FIND Med Name: deep blue polyphenol    . ASTRAGALUS PO Take by mouth. 2 daily    . CO ENZYME Q-10 PO Take 1 capsule by mouth daily. Reported on 08/18/2015    . doxycycline (VIBRA-TABS) 100 MG tablet Take 25 mg by mouth daily. Reported on 08/18/2015    . INOSITOL NIACINATE PO Take 400 mg by mouth. 2-2  X day    . LUTEIN-ZEAXANTHIN PO Take 1 tablet by mouth daily. Reported on 08/18/2015    . Multiple Vitamins-Minerals (MULTIVITAMINS THER. W/MINERALS) TABS Take 1 tablet by mouth daily. Reported on 08/18/2015    . Omega-3 Fatty Acids (FISH OIL) 1200 MG CAPS Take 2 capsules by mouth daily. Reported on 08/18/2015    . pravastatin (PRAVACHOL) 40 MG tablet Take 40 mg by mouth daily.     No facility-administered medications prior to visit.    PAST MEDICAL HISTORY: History reviewed. No pertinent past medical history.  PAST SURGICAL HISTORY: Past Surgical History  Procedure Laterality Date  . Left heart catheterization with  coronary angiogram N/A 06/14/2011    Procedure: LEFT HEART CATHETERIZATION WITH CORONARY ANGIOGRAM;  Surgeon: Laverda Page, MD;  Location: [facility] C[facility] LAB;  Service: Cardiovascular;  Laterality: N/A;  . Lower extremity angiogram Left 06/14/2011    Procedure: LOWER EXTREMITY ANGIOGRAM;  Surgeon: Laverda Page, MD;  Location: [facility] C[facility] LAB;  Service: Cardiovascular;   Laterality: Left;    FAMILY HISTORY: Family History  Problem Relation Age of Onset  . Parkinsonism Father     SOCIAL HISTORY:  Social History   Social History  . Marital Status: Married    Spouse Name: DeVanuia  . Number of Children: 0  . Years of Education: 18   Occupational History  .      retired, Engineer, production   Social History Main Topics  . Smoking status: Former Smoker    Quit date: 08/17/2009  . Smokeless tobacco: Not on file  . Alcohol Use: No     Comment: quit 1997  . Drug Use: No     Comment: quit 1997  . Sexual Activity: Not on file   Other Topics Concern  . Not on file   Social History Narrative   Lives at home with spouse   Caffeine use- coffee 3 cups daily     PHYSICAL EXAM  GENERAL EXAM/CONSTITUTIONAL: Vitals:  Filed Vitals:   10/02/15 0818  BP: 119/72  Pulse: 70  Height: 6' (1.829 m)  Weight: 207 lb 12.8 oz (94.257 kg)   Body mass index is 28.18 kg/(m^2). No exam data present  Patient is in no distress; well developed, nourished and groomed; neck is supple  CARDIOVASCULAR:  Examination of carotid arteries is normal; no carotid bruits  Regular rate and rhythm, no murmurs  Examination of peripheral vascular system by observation and palpation is normal  EYES:  Ophthalmoscopic exam of optic discs and posterior segments is normal; no papilledema or hemorrhages  MUSCULOSKELETAL:  Gait, strength, tone, movements noted in Neurologic exam below  NEUROLOGIC: MENTAL STATUS:  No flowsheet data found.  awake, alert, oriented to person, place and time  recent and remote memory intact  normal attention and concentration  language fluent, comprehension intact, naming intact,   fund of knowledge appropriate  CRANIAL NERVE:   2nd - no papilledema on fundoscopic exam  2nd, 3rd, 4th, 6th - pupils equal and reactive to light, visual fields full to confrontation, extraocular muscles intact, no nystagmus  5th - facial  sensation symmetric  7th - facial strength symmetric  8th - hearing intact  9th - palate elevates symmetrically, uvula midline  11th - shoulder shrug symmetric  12th - tongue protrusion midline  MOTOR:   normal bulk and tone, full strength in the BUE, BLE  SENSORY:   normal and symmetric to light touch  COORDINATION:   finger-nose-finger, fine finger movements normal  REFLEXES:   deep tendon reflexes TRACE and symmetric  GAIT/STATION:   narrow based gait; able to walk tandem; romberg is negative    DIAGNOSTIC DATA (LABS, IMAGING, TESTING) - I reviewed patient records, labs, notes, testing and imaging myself where available.  No results found for: WBC, HGB, HCT, MCV, PLT No results found for: NA, K, CL, CO2, GLUCOSE, BUN, CREATININE, CALCIUM, PROT, ALBUMIN, AST, ALT, ALKPHOS, BILITOT, GFRNONAA, GFRAA No results found for: CHOL, HDL, LDLCALC, LDLDIRECT, TRIG, CHOLHDL No results found for: HGBA1C No results found for: VITAMINB12 No results found for: TSH  07/17/15 CT abdomen  1. 15 mm left adrenal nodule is stable from 2011  consistent with benign process such as adenoma. 2. Left nephrolithiasis.  06/12/15 CT chest  - Lung-RADS Category 2, benign appearance or behavior. Continue annual screening with low-dose chest CT without contrast in 12 months. - Coronary artery atherosclerosis. - Emphysema.  08/21/15 EMG/NCS  - normal    ASSESSMENT AND PLAN  65 y.o. year old male here with constellation of symptoms including muscle aches and pain, joint pains, shooting pains, foggy headedness, easy bruising, decreased coordination since August 2016. Overall suspect peripheral etiology of muscle aches and shooting pain, such as neuropathy or myopathy/myalgia. His memory problems may be secondary to his pain issues.    Ddx: myopathy/ myalgia, musculoskeletal, central pain syndrome  Memory loss - Plan: MR Brain W Wo Contrast  Central pain syndrome - Plan: MR Brain W Wo  Contrast    PLAN: I spent 40 minutes of face to face time with patient. Greater than 50% of time was spent in counseling and coordination of care with patient. In summary we discussed:  - MRI brain for memory evaluation and pain evaluation - trial of duloxetine and cyclobenzaprine  Orders Placed This Encounter  Procedures  . MR Brain W Wo Contrast   Meds ordered this encounter  Medications  . DULoxetine (CYMBALTA) 30 MG capsule    Sig: Take 1 capsule (30 mg total) by mouth daily.    Dispense:  30 capsule    Refill:  6  . cyclobenzaprine (FLEXERIL) 10 MG tablet    Sig: Take 1 tablet (10 mg total) by mouth 3 (three) times daily as needed for muscle spasms.    Dispense:  90 tablet    Refill:  3   Return in about 3 months (around 12/30/2015).     Penni Bombard, MD [id number], [id number] AM Certified in Neurology, Neurophysiology and Neuroimaging  [facility] [address] [place],  [address] [phone]

## 2015-10-28 ENCOUNTER — Ambulatory Visit (INDEPENDENT_AMBULATORY_CARE_PROVIDER_SITE_OTHER): Payer: BLUE CROSS/BLUE SHIELD

## 2015-10-28 DIAGNOSIS — R413 Other amnesia: Secondary | ICD-10-CM | POA: Diagnosis not present

## 2015-10-28 DIAGNOSIS — G890 Central pain syndrome: Secondary | ICD-10-CM | POA: Diagnosis not present

## 2015-10-28 MED ORDER — GADOPENTETATE DIMEGLUMINE 469.01 MG/ML IV SOLN: 20.0000 mL | Freq: Once | INTRAVENOUS | Status: AC | PRN

## 2015-11-02 ENCOUNTER — Telehealth: Payer: Self-pay | Admitting: Diagnostic Neuroimaging

## 2015-11-02 MED ORDER — DULOXETINE HCL 30 MG PO CPEP: 30.0000 mg | ORAL_CAPSULE | Freq: Two times a day (BID) | ORAL | Status: DC

## 2015-11-02 NOTE — Telephone Encounter (Signed)
Patient called to request results of MRI and to advise that he has been taking DULoxetine (CYMBALTA) 30 MG capsule twice a day and needs Rx for increased dose.

## 2015-11-02 NOTE — Telephone Encounter (Signed)
I sent in rx for increase duloxetine. MRI shows mild scar tissue, but no major findings. -VRP

## 2015-11-03 NOTE — Telephone Encounter (Signed)
Spoke with patient and infromed him, per Dr Leta Baptist MRI shows mild scar tissue but no major findings. Also informed him that script for increased dose of cymbalta sent to his pharmacy last night. He inquired if he will be able to se MRI results on My Chart; advised he should be able to view. He stated he may have more questions; advised he call or send message through my chart with any questions. He verbalized understanding, appreciation.

## 2016-01-01 ENCOUNTER — Encounter: Payer: Self-pay | Admitting: Diagnostic Neuroimaging

## 2016-01-01 ENCOUNTER — Ambulatory Visit (INDEPENDENT_AMBULATORY_CARE_PROVIDER_SITE_OTHER): Payer: BLUE CROSS/BLUE SHIELD | Admitting: Diagnostic Neuroimaging

## 2016-01-01 VITALS — BP 122/80 | HR 74 | Ht 72.0 in | Wt 205.6 lb

## 2016-01-01 DIAGNOSIS — M791 Myalgia, unspecified site: Secondary | ICD-10-CM

## 2016-01-01 DIAGNOSIS — G890 Central pain syndrome: Secondary | ICD-10-CM

## 2016-01-01 DIAGNOSIS — R413 Other amnesia: Secondary | ICD-10-CM | POA: Diagnosis not present

## 2016-01-01 NOTE — Addendum Note (Signed)
Addended by: Minna Antis on: 01/01/2016 10:54 AM   Modules accepted: Medications

## 2016-01-01 NOTE — Progress Notes (Signed)
[facility]  PATIENT: Brandon Jarvis DOB: Dec 26, 1950  REFERRING CLINICIAN:  HISTORY FROM: patient REASON FOR VISIT: follow up   HISTORICAL  CHIEF COMPLAINT:  Chief Complaint  Patient presents with  . Memory Loss    rm 6, review MRI, MMSE 29, "difficulty finding words; pain in arms, legs, joints unchanged"  . Follow-up    3 month    HISTORY OF PRESENT ILLNESS:   UPDATE 01/01/16: Since last visit, has seen rheumatology ([facility] --> no specific dx found). Integrative medicine and functional neurology evaluations and treatments are ongoing (now on valgancyclovir + supplements). Duloxetine may be helping with mood, not so much with pain. Gabapentin helps with sleep.   UPDATE 10/02/15: Pain and memory loss continues. More agitation. Tried ibuprofen, tramadol, tylenol, hydrocodone, without relief.  PRIOR HPI (08/18/15): 65 year old right-handed male here for evaluation of constellation of symptoms including muscle aches and pain, joint pains, shooting pains, foggy headedness, easy bruising, decreased coordination since August 2016. Patient has had some mild intermittent joint pains throughout his adult life but nothing severe enough to warrant medical attention. August 2016 he had an increase in muscle aches, burning sensation, joint pains, joint aches, which seem to affect right or left side, arms or legs, in a random fashion. Symptoms occur almost on a daily basis now. When he is active and doing work, this seems to distract him from his symptoms and he does not feel significant problems. When he is at rest he notices the symptoms more. Activity does not seem to aggravate the problem. In addition patient having some intermittent electrical shooting pains in his feet, calves, legs. In addition patient also having a "foggy headed sensation" with difficulty with concentration, short-term memory and attention. Patient is particularly concerned given his family history of Parkinson's  disease and dementia in his father. No specific triggering or aggravating factors. No recent traumas, accidents, injuries. No change in diet, exercise, activity, stress. Patient has tried hydrocodone and morphine from his wife's supply to see if this would help and it does not given him any benefit in symptom control. Patient is seen PCP, rheumatology, had extensive blood work testing without specific diagnosis.   REVIEW OF SYSTEMS: Full 14 system review of systems performed and notable only for joint pain aching muscles and microhematuria. He sleeps 6-7 hours at night. Denies any snoring or breathing problems.   ALLERGIES: Allergies  Allergen Reactions  . Wasp Venom Shortness Of Breath    HOME MEDICATIONS: Outpatient Prescriptions Prior to Visit  Medication Sig Dispense Refill  . aspirin EC 81 MG tablet Take 81 mg by mouth daily. Reported on 08/18/2015    . DULoxetine (CYMBALTA) 30 MG capsule Take 1 capsule (30 mg total) by mouth 2 (two) times daily. 60 capsule 12  . cyclobenzaprine (FLEXERIL) 10 MG tablet Take 1 tablet (10 mg total) by mouth 3 (three) times daily as needed for muscle spasms. 90 tablet 3  . NON FORMULARY frankenscence essential oil 2 x daily    . UNABLE TO FIND Med Name: bacopa complex twice daily    . UNABLE TO FIND Med Name: deep blue polyphenol     Facility-Administered Medications Prior to Visit  Medication Dose Route Frequency Provider Last Rate Last Dose  . gadopentetate dimeglumine (MAGNEVIST) injection 20 mL  20 mL Intravenous Once PRN Melvenia Beam, MD        PAST MEDICAL HISTORY: History reviewed. No pertinent past medical history.  PAST SURGICAL HISTORY: Past Surgical History  Procedure Laterality Date  . Left heart catheterization with coronary angiogram N/A 06/14/2011    Procedure: LEFT HEART CATHETERIZATION WITH CORONARY ANGIOGRAM;  Surgeon: Laverda Page, MD;  Location: [facility] CATH LAB;  Service: Cardiovascular;  Laterality: N/A;  . Lower extremity  angiogram Left 06/14/2011    Procedure: LOWER EXTREMITY ANGIOGRAM;  Surgeon: Laverda Page, MD;  Location: [facility] CATH LAB;  Service: Cardiovascular;  Laterality: Left;    FAMILY HISTORY: Family History  Problem Relation Age of Onset  . Parkinsonism Father     SOCIAL HISTORY:  Social History   Social History  . Marital Status: Married    Spouse Name: DeVanuia  . Number of Children: 0  . Years of Education: 18   Occupational History  .      retired, Engineer, production   Social History Main Topics  . Smoking status: Former Smoker    Quit date: 08/17/2009  . Smokeless tobacco: Not on file  . Alcohol Use: No     Comment: quit 1997  . Drug Use: No     Comment: quit 1997  . Sexual Activity: Not on file   Other Topics Concern  . Not on file   Social History Narrative   Lives at home with spouse   Caffeine use- coffee 3 cups daily     PHYSICAL EXAM  GENERAL EXAM/CONSTITUTIONAL: Vitals:  Filed Vitals:   01/01/16 0959  BP: 122/80  Pulse: 74  Height: 6' (1.829 m)  Weight: 205 lb 9.6 oz (93.26 kg)   Body mass index is 27.88 kg/(m^2). No exam data present  Patient is in no distress; well developed, nourished and groomed; neck is supple  CARDIOVASCULAR:  Examination of carotid arteries is normal; no carotid bruits  Regular rate and rhythm, no murmurs  Examination of peripheral vascular system by observation and palpation is normal  EYES:  Ophthalmoscopic exam of optic discs and posterior segments is normal; no papilledema or hemorrhages  MUSCULOSKELETAL:  Gait, strength, tone, movements noted in Neurologic exam below  NEUROLOGIC: MENTAL STATUS:  MMSE - Mini Mental State Exam 01/01/2016  Orientation to time 5  Orientation to Place 5  Registration 3  Attention/ Calculation 5  Recall 3  Language- name 2 objects 2  Language- repeat 1  Language- follow 3 step command 3  Language- read & follow direction 1  Write a sentence 1  Copy design 0    Total score 29    awake, alert, oriented to person, place and time  recent and remote memory intact  normal attention and concentration  language fluent, comprehension intact, naming intact,   fund of knowledge appropriate  CRANIAL NERVE:   2nd - no papilledema on fundoscopic exam  2nd, 3rd, 4th, 6th - pupils equal and reactive to light, visual fields full to confrontation, extraocular muscles intact, no nystagmus  5th - facial sensation symmetric  7th - facial strength symmetric  8th - hearing intact  9th - palate elevates symmetrically, uvula midline  11th - shoulder shrug symmetric  12th - tongue protrusion midline  MOTOR:   normal bulk and tone, full strength in the BUE, BLE  SENSORY:   normal and symmetric to light touch  COORDINATION:   finger-nose-finger, fine finger movements normal  REFLEXES:   deep tendon reflexes TRACE and symmetric  GAIT/STATION:   narrow based gait; able to walk tandem; romberg is negative    DIAGNOSTIC DATA (LABS, IMAGING, TESTING) - I reviewed patient records, labs, notes, testing and imaging myself  where available.  No results found for: WBC, HGB, HCT, MCV, PLT No results found for: NA, K, CL, CO2, GLUCOSE, BUN, CREATININE, CALCIUM, PROT, ALBUMIN, AST, ALT, ALKPHOS, BILITOT, GFRNONAA, GFRAA No results found for: CHOL, HDL, LDLCALC, LDLDIRECT, TRIG, CHOLHDL No results found for: HGBA1C No results found for: VITAMINB12 No results found for: TSH  07/17/15 CT abdomen  1. 15 mm left adrenal nodule is stable from 2011 consistent with benign process such as adenoma. 2. Left nephrolithiasis.  06/12/15 CT chest  - Lung-RADS Category 2, benign appearance or behavior. Continue annual screening with low-dose chest CT without contrast in 12 months. - Coronary artery atherosclerosis. - Emphysema.  08/21/15 EMG/NCS  - normal  10/28/15 MRI brain (with and without) [I reviewed images myself and agree with interpretation. -VRP]   1. Mild scattered periventricular and subcortical foci of T2 hyperintensities. These findings are non-specific and considerations include autoimmune, inflammatory, post-infectious, microvascular ischemic or migraine associated etiologies. 2. No acute findings.    ASSESSMENT AND PLAN  65 y.o. year old male here with constellation of symptoms including muscle aches and pain, joint pains, shooting pains, foggy headedness, easy bruising, decreased coordination since August 2016. Overall suspect peripheral etiology of muscle aches and shooting pain, such as neuropathy or myopathy/myalgia. His memory problems may be secondary to his pain issues. MRI brain findings likely related to prior smoking history (40+ pack-years; quit in 2011).    Ddx: myalgia, musculoskeletal, central pain syndrome  Memory loss  Central pain syndrome  Muscle ache    PLAN: - continue duloxetine - may consider repeat MRI brain in 6 month (+/- MRI cervical/thoracic spine and LP)  Return in about 6 months (around 07/03/2016).     Penni Bombard, MD [id number], [id number] AM Certified in Neurology, Neurophysiology and Neuroimaging  [facility] [address], [place], [place] [address] [phone]

## 2016-07-11 DIAGNOSIS — Z0000 Encounter for general adult medical examination without abnormal findings: Secondary | ICD-10-CM | POA: Diagnosis not present

## 2016-07-11 DIAGNOSIS — J449 Chronic obstructive pulmonary disease, unspecified: Secondary | ICD-10-CM | POA: Diagnosis not present

## 2016-07-11 DIAGNOSIS — D696 Thrombocytopenia, unspecified: Secondary | ICD-10-CM | POA: Diagnosis not present

## 2016-07-11 DIAGNOSIS — Z23 Encounter for immunization: Secondary | ICD-10-CM | POA: Diagnosis not present

## 2016-07-11 DIAGNOSIS — I700 Atherosclerosis of aorta: Secondary | ICD-10-CM | POA: Diagnosis not present

## 2016-08-03 DIAGNOSIS — G609 Hereditary and idiopathic neuropathy, unspecified: Secondary | ICD-10-CM | POA: Diagnosis not present

## 2016-08-03 DIAGNOSIS — B9789 Other viral agents as the cause of diseases classified elsewhere: Secondary | ICD-10-CM | POA: Diagnosis not present

## 2016-08-03 DIAGNOSIS — R454 Irritability and anger: Secondary | ICD-10-CM | POA: Diagnosis not present

## 2016-08-17 ENCOUNTER — Encounter: Payer: Self-pay | Admitting: Diagnostic Neuroimaging

## 2016-08-17 ENCOUNTER — Ambulatory Visit (INDEPENDENT_AMBULATORY_CARE_PROVIDER_SITE_OTHER): Payer: Medicare Other | Admitting: Diagnostic Neuroimaging

## 2016-08-17 VITALS — BP 127/77 | HR 68 | Wt 212.8 lb

## 2016-08-17 DIAGNOSIS — M791 Myalgia, unspecified site: Secondary | ICD-10-CM

## 2016-08-17 DIAGNOSIS — M62838 Other muscle spasm: Secondary | ICD-10-CM

## 2016-08-17 DIAGNOSIS — G890 Central pain syndrome: Secondary | ICD-10-CM | POA: Diagnosis not present

## 2016-08-17 DIAGNOSIS — R413 Other amnesia: Secondary | ICD-10-CM | POA: Diagnosis not present

## 2016-08-17 DIAGNOSIS — M792 Neuralgia and neuritis, unspecified: Secondary | ICD-10-CM | POA: Diagnosis not present

## 2016-08-17 MED ORDER — DULOXETINE HCL 30 MG PO CPEP: 30.0000 mg | ORAL_CAPSULE | Freq: Two times a day (BID) | ORAL | 4 refills | Status: DC

## 2016-08-17 NOTE — Progress Notes (Signed)
[facility]  PATIENT: Brandon Jarvis DOB: 1950-12-03  REFERRING CLINICIAN:  HISTORY FROM: patient REASON FOR VISIT: follow up   HISTORICAL  CHIEF COMPLAINT:  Chief Complaint  Patient presents with  . Memory Loss    rm 6, MMSE 30, "joint and muscle pain not as bad as last year"  . Follow-up    6 month    HISTORY OF PRESENT ILLNESS:   UPDATE 08/17/16: Since last visit, doing well. Continues with nutritional supplements and anti-viral therapy. Joint pain and foggy headed sensations are slightly improved.   UPDATE 01/01/16: Since last visit, has seen rheumatology ([facility] --> no specific dx found). Integrative medicine and functional neurology evaluations and treatments are ongoing (now on valgancyclovir + supplements). Duloxetine may be helping with mood, not so much with pain. Gabapentin helps with sleep.   UPDATE 10/02/15: Pain and memory loss continues. More agitation. Tried ibuprofen, tramadol, tylenol, hydrocodone, without relief.  PRIOR HPI (08/18/15): 66 year old right-handed male here for evaluation of constellation of symptoms including muscle aches and pain, joint pains, shooting pains, foggy headedness, easy bruising, decreased coordination since August 2016. Patient has had some mild intermittent joint pains throughout his adult life but nothing severe enough to warrant medical attention. August 2016 he had an increase in muscle aches, burning sensation, joint pains, joint aches, which seem to affect right or left side, arms or legs, in a random fashion. Symptoms occur almost on a daily basis now. When he is active and doing work, this seems to distract him from his symptoms and he does not feel significant problems. When he is at rest he notices the symptoms more. Activity does not seem to aggravate the problem. In addition patient having some intermittent electrical shooting pains in his feet, calves, legs. In addition patient also having a "foggy headed  sensation" with difficulty with concentration, short-term memory and attention. Patient is particularly concerned given his family history of Parkinson's disease and dementia in his father. No specific triggering or aggravating factors. No recent traumas, accidents, injuries. No change in diet, exercise, activity, stress. Patient has tried hydrocodone and morphine from his wife's supply to see if this would help and it does not given him any benefit in symptom control. Patient is seen PCP, rheumatology, had extensive blood work testing without specific diagnosis.   REVIEW OF SYSTEMS: Full 14 system review of systems performed and negative excpet for: snoring, joint pain aching muscles.    ALLERGIES: Allergies  Allergen Reactions  . Wasp Venom Shortness Of Breath    HOME MEDICATIONS: Outpatient Medications Prior to Visit  Medication Sig Dispense Refill  . aspirin EC 81 MG tablet Take 81 mg by mouth daily. Reported on 08/18/2015    . DULoxetine (CYMBALTA) 30 MG capsule Take 1 capsule (30 mg total) by mouth 2 (two) times daily. 60 capsule 12  . gabapentin (NEURONTIN) 300 MG capsule 300 mg at bedtime.    Marland Kitchen UNABLE TO FIND Med Name: mult nutritional supplements: 5-MTHF 5 mg Liposomal glutathione Black currant seed DHA ultimate oxymatrine 1 gm NAC 900 mg itis-wise immunopad phyto ADR    . valGANciclovir (VALCYTE) 450 MG tablet 450 mg 2 (two) times daily.  0   Facility-Administered Medications Prior to Visit  Medication Dose Route Frequency Provider Last Rate Last Dose  . gadopentetate dimeglumine (MAGNEVIST) injection 20 mL  20 mL Intravenous Once PRN Melvenia Beam, MD        PAST MEDICAL HISTORY: History reviewed. No pertinent past medical history.  PAST SURGICAL HISTORY: Past Surgical History:  Procedure Laterality Date  . LEFT HEART CATHETERIZATION WITH CORONARY ANGIOGRAM N/A 06/14/2011   Procedure: LEFT HEART CATHETERIZATION WITH CORONARY ANGIOGRAM;  Surgeon: Laverda Page,  MD;  Location: [facility] CATH LAB;  Service: Cardiovascular;  Laterality: N/A;  . LOWER EXTREMITY ANGIOGRAM Left 06/14/2011   Procedure: LOWER EXTREMITY ANGIOGRAM;  Surgeon: Laverda Page, MD;  Location: [facility] CATH LAB;  Service: Cardiovascular;  Laterality: Left;    FAMILY HISTORY: Family History  Problem Relation Age of Onset  . Parkinsonism Father   . Cancer - Colon Sister     SOCIAL HISTORY:  Social History   Social History  . Marital status: Married    Spouse name: DeVanuia  . Number of children: 0  . Years of education: 50   Occupational History  .      retired, Engineer, production   Social History Main Topics  . Smoking status: Former Smoker    Quit date: 08/17/2009  . Smokeless tobacco: Never Used  . Alcohol use No     Comment: quit 1997  . Drug use: No     Comment: quit 1997  . Sexual activity: Not on file   Other Topics Concern  . Not on file   Social History Narrative   Lives at home with spouse   Caffeine use- coffee 3 cups daily     PHYSICAL EXAM  GENERAL EXAM/CONSTITUTIONAL: Vitals:  Vitals:   08/17/16 0846  BP: 127/77  Pulse: 68  Weight: 212 lb 12.8 oz (96.5 kg)   Body mass index is 28.86 kg/m. No exam data present  Patient is in no distress; well developed, nourished and groomed; neck is supple  CARDIOVASCULAR:  Examination of carotid arteries is normal; no carotid bruits  Regular rate and rhythm, no murmurs  Examination of peripheral vascular system by observation and palpation is normal  EYES:  Ophthalmoscopic exam of optic discs and posterior segments is normal; no papilledema or hemorrhages  MUSCULOSKELETAL:  Gait, strength, tone, movements noted in Neurologic exam below  NEUROLOGIC: MENTAL STATUS:  MMSE - Mini Mental State Exam 08/17/2016 01/01/2016  Orientation to time 5 5  Orientation to Place 5 5  Registration 3 3  Attention/ Calculation 5 5  Recall 3 3  Language- name 2 objects 2 2  Language- repeat 1 1    Language- follow 3 step command 3 3  Language- read & follow direction 1 1  Write a sentence 1 1  Copy design 1 0  Total score 30 29    awake, alert, oriented to person, place and time  recent and remote memory intact  normal attention and concentration  language fluent, comprehension intact, naming intact,   fund of knowledge appropriate  CRANIAL NERVE:   2nd - no papilledema on fundoscopic exam  2nd, 3rd, 4th, 6th - pupils equal and reactive to light, visual fields full to confrontation, extraocular muscles intact, no nystagmus  5th - facial sensation symmetric  7th - facial strength symmetric  8th - hearing intact  9th - palate elevates symmetrically, uvula midline  11th - shoulder shrug symmetric  12th - tongue protrusion midline  MOTOR:   normal bulk and tone, full strength in the BUE, BLE  SENSORY:   normal and symmetric to light touch  COORDINATION:   finger-nose-finger, fine finger movements normal  REFLEXES:   deep tendon reflexes TRACE and symmetric  GAIT/STATION:   narrow based gait; able to walk tandem; romberg is negative  DIAGNOSTIC DATA (LABS, IMAGING, TESTING) - I reviewed patient records, labs, notes, testing and imaging myself where available.  No results found for: WBC, HGB, HCT, MCV, PLT No results found for: NA, K, CL, CO2, GLUCOSE, BUN, CREATININE, CALCIUM, PROT, ALBUMIN, AST, ALT, ALKPHOS, BILITOT, GFRNONAA, GFRAA No results found for: CHOL, HDL, LDLCALC, LDLDIRECT, TRIG, CHOLHDL No results found for: HGBA1C No results found for: VITAMINB12 No results found for: TSH  07/17/15 CT abdomen  1. 15 mm left adrenal nodule is stable from 2011 consistent with benign process such as adenoma. 2. Left nephrolithiasis.  06/12/15 CT chest  - Lung-RADS Category 2, benign appearance or behavior. Continue annual screening with low-dose chest CT without contrast in 12 months. - Coronary artery atherosclerosis. - Emphysema.  08/21/15  EMG/NCS  - normal  10/28/15 MRI brain (with and without) [I reviewed images myself and agree with interpretation. -VRP]  1. Mild scattered periventricular and subcortical foci of T2 hyperintensities. These findings are non-specific and considerations include autoimmune, inflammatory, post-infectious, microvascular ischemic or migraine associated etiologies. 2. No acute findings.    ASSESSMENT AND PLAN  66 y.o. year old male here with constellation of symptoms including muscle aches and pain, joint pains, shooting pains, foggy headedness, easy bruising, decreased coordination since August 2016. Overall suspect peripheral etiology of muscle aches and shooting pain, such as neuropathy or myopathy/myalgia. His memory problems may be secondary to his pain issues. MRI brain findings likely related to prior smoking history (40+ pack-years; quit in 2011).    Ddx: myalgia, musculoskeletal, central pain syndrome  Memory loss  Central pain syndrome  Muscle ache  Muscle spasm  Neuropathic pain    PLAN: - continue current regimen per functional neurologist  - continue duloxetine for anxiety (will give 1 year of refills and then ask PCP or other neurologist to continue refills) - monitor symptoms - follow up here as needed   Meds ordered this encounter  Medications  . DULoxetine (CYMBALTA) 30 MG capsule    Sig: Take 1 capsule (30 mg total) by mouth 2 (two) times daily.    Dispense:  180 capsule    Refill:  4   Return if symptoms worsen or fail to improve, for return to PCP (Dr. Noah Delaine) and neurologist (Dr. Halina Andreas).     Penni Bombard, MD [id number], [id number] AM Certified in Neurology, Neurophysiology and Neuroimaging  [facility] [address] [place], [place] [address] [phone]

## 2016-08-19 DIAGNOSIS — R52 Pain, unspecified: Secondary | ICD-10-CM | POA: Diagnosis not present

## 2016-08-19 DIAGNOSIS — R7982 Elevated C-reactive protein (CRP): Secondary | ICD-10-CM | POA: Diagnosis not present

## 2016-08-19 DIAGNOSIS — R5383 Other fatigue: Secondary | ICD-10-CM | POA: Diagnosis not present

## 2016-09-05 DIAGNOSIS — M064 Inflammatory polyarthropathy: Secondary | ICD-10-CM | POA: Diagnosis not present

## 2016-09-05 DIAGNOSIS — Z79899 Other long term (current) drug therapy: Secondary | ICD-10-CM | POA: Diagnosis not present

## 2016-09-05 DIAGNOSIS — R7982 Elevated C-reactive protein (CRP): Secondary | ICD-10-CM | POA: Diagnosis not present

## 2016-10-06 DIAGNOSIS — M064 Inflammatory polyarthropathy: Secondary | ICD-10-CM | POA: Diagnosis not present

## 2016-10-06 DIAGNOSIS — Z79899 Other long term (current) drug therapy: Secondary | ICD-10-CM | POA: Diagnosis not present

## 2016-10-06 DIAGNOSIS — R7982 Elevated C-reactive protein (CRP): Secondary | ICD-10-CM | POA: Diagnosis not present

## 2016-10-13 DIAGNOSIS — S61212A Laceration without foreign body of right middle finger without damage to nail, initial encounter: Secondary | ICD-10-CM | POA: Diagnosis not present

## 2016-10-17 DIAGNOSIS — Z79899 Other long term (current) drug therapy: Secondary | ICD-10-CM | POA: Diagnosis not present

## 2016-11-04 ENCOUNTER — Telehealth: Payer: Self-pay | Admitting: Diagnostic Neuroimaging

## 2016-11-04 NOTE — Telephone Encounter (Signed)
Per Dr Leta Baptist, spoke with patient and advised he discuss with PCP or functional neurologist. Advised Dr Leta Baptist did not discuss tapering off Cymbalta with him. He verbalized understanding, appreciation.

## 2016-11-04 NOTE — Telephone Encounter (Signed)
He should ask PCP or functional neurologist. -VRP

## 2016-11-04 NOTE — Telephone Encounter (Signed)
Patient called office in reference to wanting to DULoxetine (CYMBALTA) 30 MG capsule.  Patient has been taking 1 a day and would like to be switched to the 20mg  and what to do after this as far as discontinuing.  Please call

## 2016-11-14 DIAGNOSIS — R197 Diarrhea, unspecified: Secondary | ICD-10-CM | POA: Diagnosis not present

## 2016-11-14 DIAGNOSIS — R194 Change in bowel habit: Secondary | ICD-10-CM | POA: Diagnosis not present

## 2016-11-15 DIAGNOSIS — M79644 Pain in right finger(s): Secondary | ICD-10-CM | POA: Diagnosis not present

## 2016-11-15 DIAGNOSIS — S61212D Laceration without foreign body of right middle finger without damage to nail, subsequent encounter: Secondary | ICD-10-CM | POA: Diagnosis not present

## 2016-11-15 DIAGNOSIS — R197 Diarrhea, unspecified: Secondary | ICD-10-CM | POA: Diagnosis not present

## 2016-11-15 DIAGNOSIS — M7989 Other specified soft tissue disorders: Secondary | ICD-10-CM | POA: Diagnosis not present

## 2016-11-17 DIAGNOSIS — Z79899 Other long term (current) drug therapy: Secondary | ICD-10-CM | POA: Diagnosis not present

## 2016-11-17 DIAGNOSIS — M064 Inflammatory polyarthropathy: Secondary | ICD-10-CM | POA: Diagnosis not present

## 2016-11-17 DIAGNOSIS — R7982 Elevated C-reactive protein (CRP): Secondary | ICD-10-CM | POA: Diagnosis not present

## 2016-11-23 DIAGNOSIS — R197 Diarrhea, unspecified: Secondary | ICD-10-CM | POA: Diagnosis not present

## 2016-12-05 DIAGNOSIS — B2790 Infectious mononucleosis, unspecified without complication: Secondary | ICD-10-CM | POA: Diagnosis not present

## 2016-12-05 DIAGNOSIS — B349 Viral infection, unspecified: Secondary | ICD-10-CM | POA: Diagnosis not present

## 2016-12-06 DIAGNOSIS — R194 Change in bowel habit: Secondary | ICD-10-CM | POA: Diagnosis not present

## 2016-12-08 DIAGNOSIS — M654 Radial styloid tenosynovitis [de Quervain]: Secondary | ICD-10-CM | POA: Diagnosis not present

## 2016-12-23 DIAGNOSIS — Z1159 Encounter for screening for other viral diseases: Secondary | ICD-10-CM | POA: Diagnosis not present

## 2016-12-28 DIAGNOSIS — D696 Thrombocytopenia, unspecified: Secondary | ICD-10-CM | POA: Diagnosis not present

## 2016-12-28 DIAGNOSIS — E785 Hyperlipidemia, unspecified: Secondary | ICD-10-CM | POA: Diagnosis not present

## 2016-12-29 DIAGNOSIS — M654 Radial styloid tenosynovitis [de Quervain]: Secondary | ICD-10-CM | POA: Diagnosis not present

## 2017-01-09 DIAGNOSIS — M791 Myalgia: Secondary | ICD-10-CM | POA: Diagnosis not present

## 2017-01-09 DIAGNOSIS — M654 Radial styloid tenosynovitis [de Quervain]: Secondary | ICD-10-CM | POA: Diagnosis not present

## 2017-01-09 DIAGNOSIS — M9907 Segmental and somatic dysfunction of upper extremity: Secondary | ICD-10-CM | POA: Diagnosis not present

## 2017-01-11 DIAGNOSIS — Z23 Encounter for immunization: Secondary | ICD-10-CM | POA: Diagnosis not present

## 2017-01-11 DIAGNOSIS — M654 Radial styloid tenosynovitis [de Quervain]: Secondary | ICD-10-CM | POA: Diagnosis not present

## 2017-01-11 DIAGNOSIS — D696 Thrombocytopenia, unspecified: Secondary | ICD-10-CM | POA: Diagnosis not present

## 2017-01-11 DIAGNOSIS — I2510 Atherosclerotic heart disease of native coronary artery without angina pectoris: Secondary | ICD-10-CM | POA: Diagnosis not present

## 2017-01-11 DIAGNOSIS — M791 Myalgia: Secondary | ICD-10-CM | POA: Diagnosis not present

## 2017-01-11 DIAGNOSIS — E785 Hyperlipidemia, unspecified: Secondary | ICD-10-CM | POA: Diagnosis not present

## 2017-01-11 DIAGNOSIS — M9907 Segmental and somatic dysfunction of upper extremity: Secondary | ICD-10-CM | POA: Diagnosis not present

## 2017-01-11 DIAGNOSIS — I700 Atherosclerosis of aorta: Secondary | ICD-10-CM | POA: Diagnosis not present

## 2017-01-13 DIAGNOSIS — M791 Myalgia: Secondary | ICD-10-CM | POA: Diagnosis not present

## 2017-01-13 DIAGNOSIS — M654 Radial styloid tenosynovitis [de Quervain]: Secondary | ICD-10-CM | POA: Diagnosis not present

## 2017-01-13 DIAGNOSIS — M9907 Segmental and somatic dysfunction of upper extremity: Secondary | ICD-10-CM | POA: Diagnosis not present

## 2017-01-16 DIAGNOSIS — M9907 Segmental and somatic dysfunction of upper extremity: Secondary | ICD-10-CM | POA: Diagnosis not present

## 2017-01-16 DIAGNOSIS — M791 Myalgia: Secondary | ICD-10-CM | POA: Diagnosis not present

## 2017-01-16 DIAGNOSIS — M654 Radial styloid tenosynovitis [de Quervain]: Secondary | ICD-10-CM | POA: Diagnosis not present

## 2017-01-18 DIAGNOSIS — M654 Radial styloid tenosynovitis [de Quervain]: Secondary | ICD-10-CM | POA: Diagnosis not present

## 2017-01-18 DIAGNOSIS — M9907 Segmental and somatic dysfunction of upper extremity: Secondary | ICD-10-CM | POA: Diagnosis not present

## 2017-01-18 DIAGNOSIS — M791 Myalgia: Secondary | ICD-10-CM | POA: Diagnosis not present

## 2017-01-20 DIAGNOSIS — M791 Myalgia: Secondary | ICD-10-CM | POA: Diagnosis not present

## 2017-01-20 DIAGNOSIS — M9907 Segmental and somatic dysfunction of upper extremity: Secondary | ICD-10-CM | POA: Diagnosis not present

## 2017-01-20 DIAGNOSIS — M654 Radial styloid tenosynovitis [de Quervain]: Secondary | ICD-10-CM | POA: Diagnosis not present

## 2017-01-30 DIAGNOSIS — M791 Myalgia: Secondary | ICD-10-CM | POA: Diagnosis not present

## 2017-01-30 DIAGNOSIS — M654 Radial styloid tenosynovitis [de Quervain]: Secondary | ICD-10-CM | POA: Diagnosis not present

## 2017-01-30 DIAGNOSIS — M9907 Segmental and somatic dysfunction of upper extremity: Secondary | ICD-10-CM | POA: Diagnosis not present

## 2017-02-01 DIAGNOSIS — M791 Myalgia: Secondary | ICD-10-CM | POA: Diagnosis not present

## 2017-02-01 DIAGNOSIS — M654 Radial styloid tenosynovitis [de Quervain]: Secondary | ICD-10-CM | POA: Diagnosis not present

## 2017-02-01 DIAGNOSIS — M9907 Segmental and somatic dysfunction of upper extremity: Secondary | ICD-10-CM | POA: Diagnosis not present

## 2017-02-15 DIAGNOSIS — M9907 Segmental and somatic dysfunction of upper extremity: Secondary | ICD-10-CM | POA: Diagnosis not present

## 2017-02-15 DIAGNOSIS — M654 Radial styloid tenosynovitis [de Quervain]: Secondary | ICD-10-CM | POA: Diagnosis not present

## 2017-02-15 DIAGNOSIS — M791 Myalgia: Secondary | ICD-10-CM | POA: Diagnosis not present

## 2017-02-17 DIAGNOSIS — M654 Radial styloid tenosynovitis [de Quervain]: Secondary | ICD-10-CM | POA: Diagnosis not present

## 2017-02-17 DIAGNOSIS — Z79899 Other long term (current) drug therapy: Secondary | ICD-10-CM | POA: Diagnosis not present

## 2017-02-17 DIAGNOSIS — M791 Myalgia: Secondary | ICD-10-CM | POA: Diagnosis not present

## 2017-02-17 DIAGNOSIS — M064 Inflammatory polyarthropathy: Secondary | ICD-10-CM | POA: Diagnosis not present

## 2017-02-17 DIAGNOSIS — M9907 Segmental and somatic dysfunction of upper extremity: Secondary | ICD-10-CM | POA: Diagnosis not present

## 2017-02-17 DIAGNOSIS — R7982 Elevated C-reactive protein (CRP): Secondary | ICD-10-CM | POA: Diagnosis not present

## 2017-04-03 DIAGNOSIS — S61212A Laceration without foreign body of right middle finger without damage to nail, initial encounter: Secondary | ICD-10-CM | POA: Diagnosis not present

## 2017-04-03 DIAGNOSIS — M654 Radial styloid tenosynovitis [de Quervain]: Secondary | ICD-10-CM | POA: Diagnosis not present

## 2017-04-19 DIAGNOSIS — M654 Radial styloid tenosynovitis [de Quervain]: Secondary | ICD-10-CM | POA: Diagnosis not present

## 2017-04-20 DIAGNOSIS — Z79899 Other long term (current) drug therapy: Secondary | ICD-10-CM | POA: Diagnosis not present

## 2017-04-20 DIAGNOSIS — I2510 Atherosclerotic heart disease of native coronary artery without angina pectoris: Secondary | ICD-10-CM | POA: Diagnosis not present

## 2017-04-20 DIAGNOSIS — L814 Other melanin hyperpigmentation: Secondary | ICD-10-CM | POA: Diagnosis not present

## 2017-04-20 DIAGNOSIS — L578 Other skin changes due to chronic exposure to nonionizing radiation: Secondary | ICD-10-CM | POA: Diagnosis not present

## 2017-04-20 DIAGNOSIS — D225 Melanocytic nevi of trunk: Secondary | ICD-10-CM | POA: Diagnosis not present

## 2017-04-20 DIAGNOSIS — L570 Actinic keratosis: Secondary | ICD-10-CM | POA: Diagnosis not present

## 2017-04-20 DIAGNOSIS — L821 Other seborrheic keratosis: Secondary | ICD-10-CM | POA: Diagnosis not present

## 2017-05-23 DIAGNOSIS — Z79899 Other long term (current) drug therapy: Secondary | ICD-10-CM | POA: Diagnosis not present

## 2017-06-06 DIAGNOSIS — Z23 Encounter for immunization: Secondary | ICD-10-CM | POA: Diagnosis not present

## 2017-06-23 DIAGNOSIS — Z79899 Other long term (current) drug therapy: Secondary | ICD-10-CM | POA: Diagnosis not present

## 2017-07-12 DIAGNOSIS — Z0000 Encounter for general adult medical examination without abnormal findings: Secondary | ICD-10-CM | POA: Diagnosis not present

## 2017-07-12 DIAGNOSIS — E785 Hyperlipidemia, unspecified: Secondary | ICD-10-CM | POA: Diagnosis not present

## 2017-07-12 DIAGNOSIS — Z125 Encounter for screening for malignant neoplasm of prostate: Secondary | ICD-10-CM | POA: Diagnosis not present

## 2017-07-12 DIAGNOSIS — I2510 Atherosclerotic heart disease of native coronary artery without angina pectoris: Secondary | ICD-10-CM | POA: Diagnosis not present

## 2017-07-19 DIAGNOSIS — Z1212 Encounter for screening for malignant neoplasm of rectum: Secondary | ICD-10-CM | POA: Diagnosis not present

## 2017-07-19 DIAGNOSIS — Z87891 Personal history of nicotine dependence: Secondary | ICD-10-CM | POA: Diagnosis not present

## 2017-07-19 DIAGNOSIS — I2510 Atherosclerotic heart disease of native coronary artery without angina pectoris: Secondary | ICD-10-CM | POA: Diagnosis not present

## 2017-07-19 DIAGNOSIS — Z0000 Encounter for general adult medical examination without abnormal findings: Secondary | ICD-10-CM | POA: Diagnosis not present

## 2017-08-19 ENCOUNTER — Other Ambulatory Visit: Payer: Self-pay | Admitting: Internal Medicine

## 2017-08-19 DIAGNOSIS — Z87891 Personal history of nicotine dependence: Secondary | ICD-10-CM

## 2017-08-24 ENCOUNTER — Ambulatory Visit
Admission: RE | Admit: 2017-08-24 | Discharge: 2017-08-24 | Disposition: A | Discharge status: Home / Self Care | Payer: Self-pay | Source: Ambulatory Visit | Attending: Internal Medicine | Admitting: Internal Medicine

## 2017-08-24 DIAGNOSIS — Z87891 Personal history of nicotine dependence: Secondary | ICD-10-CM

## 2017-09-14 DIAGNOSIS — J441 Chronic obstructive pulmonary disease with (acute) exacerbation: Secondary | ICD-10-CM | POA: Diagnosis not present

## 2017-09-18 DIAGNOSIS — R042 Hemoptysis: Secondary | ICD-10-CM | POA: Diagnosis not present

## 2017-09-18 DIAGNOSIS — J441 Chronic obstructive pulmonary disease with (acute) exacerbation: Secondary | ICD-10-CM | POA: Diagnosis not present

## 2017-09-18 DIAGNOSIS — R05 Cough: Secondary | ICD-10-CM | POA: Diagnosis not present

## 2017-10-03 ENCOUNTER — Institutional Professional Consult (permissible substitution): Payer: No Typology Code available for payment source | Admitting: Pulmonary Disease

## 2017-10-05 ENCOUNTER — Ambulatory Visit (INDEPENDENT_AMBULATORY_CARE_PROVIDER_SITE_OTHER): Payer: Medicare Other | Admitting: Internal Medicine

## 2017-10-05 ENCOUNTER — Encounter: Payer: Self-pay | Admitting: Internal Medicine

## 2017-10-05 VITALS — BP 128/72 | HR 66 | Ht 72.0 in | Wt 202.8 lb

## 2017-10-05 DIAGNOSIS — J449 Chronic obstructive pulmonary disease, unspecified: Secondary | ICD-10-CM

## 2017-10-05 NOTE — Assessment & Plan Note (Addendum)
Quit smoking 2011 - Spirometry 10/05/2017  FEV1 2.93 (80%)  Ratio 66 p no rx with mild classic curvature    He is group A = min symptoms, rare exacerbations so prognosis is excellent.    I reviewed the Fletcher curve with the patient that basically indicates  if you quit smoking when your best day FEV1 is still well preserved (as is clearly  the case here)  it is highly unlikely you will progress to severe disease and informed the patient there was  no medication on the market that has proven to alter the curve/ its downward trajectory  or the likelihood of progression of their disease(unlike other chronic medical conditions such as atheroclerosis where we do think we can change the natural hx with risk reducing meds)    Therefore stopping smoking and maintaining abstinence are  the most important aspects of his care, not choice of inhalers or for that matter, doctors.   Treatment other than smoking cessation  is entirely directed by severity of symptoms and focused also on reducing exacerbations, not attempting to change the natural history of the disease.     Should he have more daily symptoms chronically (doe wise)  might add lama first or lama/laba and if having more exac then laba/ics best starting rx    Pulmonary f/u can be prn as he has an excellent internist    Total time devoted to counseling  > 50 % of initial 60 min office visit:  review case with pt/ discussion of options/alternatives/ personally creating written customized instructions  in presence of pt  then going over those specific  Instructions directly with the pt including how to use all of the meds but in particular covering each new medication in detail and the difference between the maintenance= "automatic" meds and the prns using an action plan format for the latter (If this problem/symptom => do that organization reading Left to right).  Please see AVS from this visit for a full list of these instructions which I personally  wrote for this pt and  are unique to this visit.

## 2017-10-05 NOTE — Progress Notes (Signed)
Subjective:    Patient ID: Brandon Jarvis, male    DOB: 1951-03-05,   MRN: [id number]  HPI  88 yowm quit smoking 2011 with doe improved over time but concerned re LDSCT c/w emphysema so referred to pulmonary clinic 10/05/2017 by Dr   Shelia Media for copd eval with GOLD I criteria at initial eval    10/05/2017 1st [facility] office visit/ Trysta Showman   Chief Complaint  Patient presents with  . Pulmonary Consult    COPD, dx 5 years ago, productive cough with yellow/green mucus, runny nose   doe = MMRC1 = can walk nl pace, flat grade, can't  Get in a real hurry or go uphills or steps fast s sob   Still able to do yardwork / play golf  Cough mostly am scant slt yellowish now but just finished zpak/ pred with min residual nasal congestion Rare flare over last 5 years usually no need for inhalers at all    No obvious day to day or daytime variability or assoc ongoing true  excess/ purulent sputum or mucus plugs or hemoptysis or cp or chest tightness, subjective wheeze or overt  hb symptoms. No unusual exposure hx or h/o childhood pna/ asthma or knowledge of premature birth.  Sleeping ok flat without nocturnal  or early am exacerbation  of respiratory  c/o's or need for noct saba. Also denies any obvious fluctuation of symptoms with weather or environmental changes or other aggravating or alleviating factors except as outlined above   Current Allergies, Complete Past Medical History, Past Surgical History, Family History, and Social History were reviewed in [employer] record.     Current Meds  Medication Sig  . aspirin EC 81 MG tablet Take 81 mg by mouth daily. Reported on 08/18/2015  . ezetimibe (ZETIA) 10 MG tablet 10 mg daily.  Marland Kitchen NALTREXONE HCL PO Take 4.5 mg by mouth at bedtime.  Marland Kitchen UNABLE TO FIND Med Name: mult nutritional supplements: 5-MTHF 5 mg Liposomal glutathione Black currant seed DHA ultimate oxymatrine 1 gm NAC 900 mg itis-wise immunopad phyto ADR  .  valACYclovir (VALTREX) 1000 MG tablet Take 1,000 mg by mouth every 8 (eight) hours.  . [DISCONTINUED] DULoxetine (CYMBALTA) 30 MG capsule Take 1 capsule (30 mg total) by mouth 2 (two) times daily.  . [DISCONTINUED] gabapentin (NEURONTIN) 300 MG capsule 300 mg at bedtime.          Review of Systems  Constitutional: Negative for fever and unexpected weight change.  HENT: Negative for congestion, dental problem, ear pain, nosebleeds, postnasal drip, rhinorrhea, sinus pressure, sneezing, sore throat and trouble swallowing.   Eyes: Negative for redness and itching.  Respiratory: Positive for cough and shortness of breath. Negative for chest tightness and wheezing.   Cardiovascular: Negative for palpitations and leg swelling.  Gastrointestinal: Negative for nausea and vomiting.  Genitourinary: Negative for dysuria.  Musculoskeletal: Negative for joint swelling.  Skin: Negative for rash.  Neurological: Negative for headaches.  Hematological: Does not bruise/bleed easily.  Psychiatric/Behavioral: Negative for dysphoric mood. The patient is not nervous/anxious.        Objective:   Physical Exam  amb pleasant wm nad   Wt Readings from Last 3 Encounters:  10/05/17 202 lb 12.8 oz (92 kg)  08/17/16 212 lb 12.8 oz (96.5 kg)  01/01/16 205 lb 9.6 oz (93.3 kg)     Vital signs reviewed - Note on arrival 02 sats  95% on RA     HEENT: nl dentition,  turbinates bilaterally, and oropharynx. Nl external ear canals without cough reflex   NECK :  without JVD/Nodes/TM/ nl carotid upstrokes bilaterally   LUNGS: no acc muscle use,   Mild barrel chest with diminished bs and hyper resonant to percussion    CV:  RRR  no s3 or murmur or increase in P2, and no edema   ABD:  soft and nontender with neg Hoover's sign  in the supine position. No bruits or organomegaly appreciated, bowel sounds nl  MS:  Nl gait/ ext warm without deformities, calf tenderness, cyanosis or clubbing No obvious joint  restrictions   SKIN: warm and dry without lesions    NEURO:  alert, approp, nl sensorium with  no motor or cerebellar deficits apparent.     I personally reviewed images and agree with radiology impression as follows:   Chest CT 08/24/17 1. Lung-RADS 2, benign appearance or behavior. Continue annual screening with low-dose chest CT without contrast in 12 months. 2. Coronary artery and Aortic Atherosclerois (ICD10-170.0) 3.  Emphysema. ([id number]-U03.9)     Assessment & Plan:

## 2017-10-05 NOTE — Patient Instructions (Signed)
You have a GOLD 1 severity copd (very mild) which includes the component due to emphysema   In the absence of limited shortness of breath or more frequent flares of bronchitis, there is no indication for additional work up or medications, so pulmonary follow up can be as needed

## 2017-10-18 DIAGNOSIS — L819 Disorder of pigmentation, unspecified: Secondary | ICD-10-CM | POA: Diagnosis not present

## 2017-10-18 DIAGNOSIS — L578 Other skin changes due to chronic exposure to nonionizing radiation: Secondary | ICD-10-CM | POA: Diagnosis not present

## 2017-11-15 DIAGNOSIS — N200 Calculus of kidney: Secondary | ICD-10-CM | POA: Diagnosis not present

## 2017-11-15 DIAGNOSIS — Z92241 Personal history of systemic steroid therapy: Secondary | ICD-10-CM | POA: Diagnosis not present

## 2017-11-15 DIAGNOSIS — E279 Disorder of adrenal gland, unspecified: Secondary | ICD-10-CM | POA: Diagnosis not present

## 2017-11-15 DIAGNOSIS — M2550 Pain in unspecified joint: Secondary | ICD-10-CM | POA: Diagnosis not present

## 2017-11-22 DIAGNOSIS — E279 Disorder of adrenal gland, unspecified: Secondary | ICD-10-CM | POA: Diagnosis not present

## 2018-01-12 DIAGNOSIS — K1370 Unspecified lesions of oral mucosa: Secondary | ICD-10-CM | POA: Diagnosis not present

## 2018-01-12 DIAGNOSIS — L255 Unspecified contact dermatitis due to plants, except food: Secondary | ICD-10-CM | POA: Diagnosis not present

## 2018-01-17 DIAGNOSIS — I2510 Atherosclerotic heart disease of native coronary artery without angina pectoris: Secondary | ICD-10-CM | POA: Diagnosis not present

## 2018-04-17 DIAGNOSIS — D485 Neoplasm of uncertain behavior of skin: Secondary | ICD-10-CM | POA: Diagnosis not present

## 2018-04-17 DIAGNOSIS — D225 Melanocytic nevi of trunk: Secondary | ICD-10-CM | POA: Diagnosis not present

## 2018-04-17 DIAGNOSIS — B353 Tinea pedis: Secondary | ICD-10-CM | POA: Diagnosis not present

## 2018-04-17 DIAGNOSIS — L738 Other specified follicular disorders: Secondary | ICD-10-CM | POA: Diagnosis not present

## 2018-04-17 DIAGNOSIS — D1801 Hemangioma of skin and subcutaneous tissue: Secondary | ICD-10-CM | POA: Diagnosis not present

## 2018-04-17 DIAGNOSIS — L821 Other seborrheic keratosis: Secondary | ICD-10-CM | POA: Diagnosis not present

## 2018-05-11 DIAGNOSIS — Z23 Encounter for immunization: Secondary | ICD-10-CM | POA: Diagnosis not present

## 2018-07-23 DIAGNOSIS — Z125 Encounter for screening for malignant neoplasm of prostate: Secondary | ICD-10-CM | POA: Diagnosis not present

## 2018-07-23 DIAGNOSIS — Z1159 Encounter for screening for other viral diseases: Secondary | ICD-10-CM | POA: Diagnosis not present

## 2018-07-23 DIAGNOSIS — J449 Chronic obstructive pulmonary disease, unspecified: Secondary | ICD-10-CM | POA: Diagnosis not present

## 2018-07-23 DIAGNOSIS — D696 Thrombocytopenia, unspecified: Secondary | ICD-10-CM | POA: Diagnosis not present

## 2018-07-23 DIAGNOSIS — E785 Hyperlipidemia, unspecified: Secondary | ICD-10-CM | POA: Diagnosis not present

## 2018-07-26 DIAGNOSIS — J441 Chronic obstructive pulmonary disease with (acute) exacerbation: Secondary | ICD-10-CM | POA: Diagnosis not present

## 2018-07-26 DIAGNOSIS — D696 Thrombocytopenia, unspecified: Secondary | ICD-10-CM | POA: Diagnosis not present

## 2018-07-26 DIAGNOSIS — F17211 Nicotine dependence, cigarettes, in remission: Secondary | ICD-10-CM | POA: Diagnosis not present

## 2018-07-26 DIAGNOSIS — D892 Hypergammaglobulinemia, unspecified: Secondary | ICD-10-CM | POA: Diagnosis not present

## 2018-07-26 DIAGNOSIS — B1910 Unspecified viral hepatitis B without hepatic coma: Secondary | ICD-10-CM | POA: Diagnosis not present

## 2018-07-26 DIAGNOSIS — I700 Atherosclerosis of aorta: Secondary | ICD-10-CM | POA: Diagnosis not present

## 2018-07-26 DIAGNOSIS — J449 Chronic obstructive pulmonary disease, unspecified: Secondary | ICD-10-CM | POA: Diagnosis not present

## 2018-07-26 DIAGNOSIS — J340 Abscess, furuncle and carbuncle of nose: Secondary | ICD-10-CM | POA: Diagnosis not present

## 2018-07-26 DIAGNOSIS — I2510 Atherosclerotic heart disease of native coronary artery without angina pectoris: Secondary | ICD-10-CM | POA: Diagnosis not present

## 2018-07-26 DIAGNOSIS — Z1212 Encounter for screening for malignant neoplasm of rectum: Secondary | ICD-10-CM | POA: Diagnosis not present

## 2018-07-26 DIAGNOSIS — E785 Hyperlipidemia, unspecified: Secondary | ICD-10-CM | POA: Diagnosis not present

## 2018-09-15 DIAGNOSIS — H6121 Impacted cerumen, right ear: Secondary | ICD-10-CM | POA: Diagnosis not present

## 2018-09-15 DIAGNOSIS — H6120 Impacted cerumen, unspecified ear: Secondary | ICD-10-CM | POA: Diagnosis not present

## 2018-09-15 DIAGNOSIS — H6013 Cellulitis of external ear, bilateral: Secondary | ICD-10-CM | POA: Diagnosis not present

## 2018-09-15 DIAGNOSIS — Z6827 Body mass index (BMI) 27.0-27.9, adult: Secondary | ICD-10-CM | POA: Diagnosis not present

## 2018-12-06 IMAGING — CT CT CHEST LUNG CANCER SCREENING LOW DOSE W/O CM
1 of 5 series · 15 of 40 positions shown, 19 images · non-contrast
Comparison: 06/12/2015

CLINICAL DATA: 66-year-old male with 40 pack-year history of
smoking. Lung cancer screening.

EXAM:
CT CHEST WITHOUT CONTRAST LOW-DOSE FOR LUNG CANCER SCREENING
TECHNIQUE: Multidetector CT imaging of the chest was performed following the
standard protocol without IV contrast.

[Series 4: lung windows · axial · 0.78mm/px · z∈[-368,-74]mm · 15 of 261 slices shown, 19 images]
[im 13/261  mediastinal]
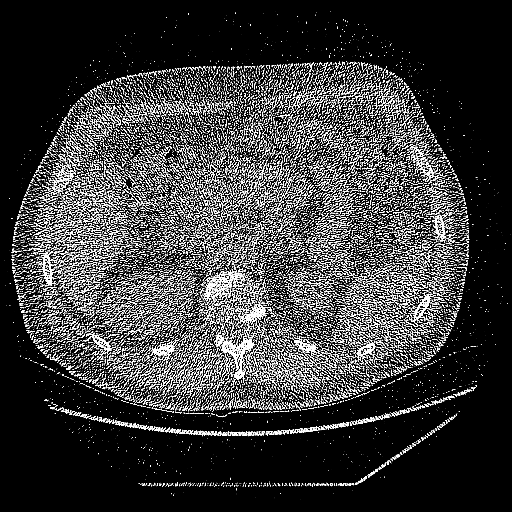
[im 13/261  lung]
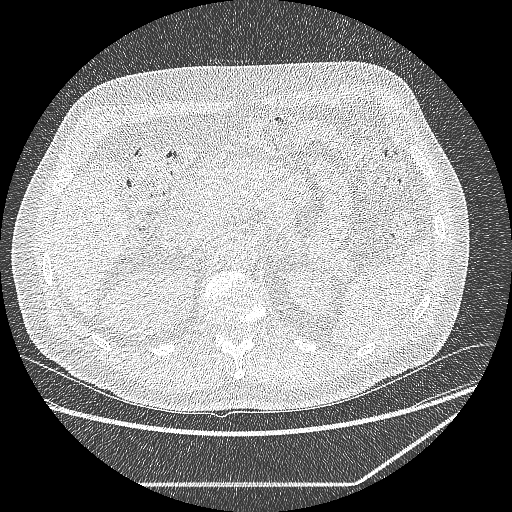
[im 38/261  lung]
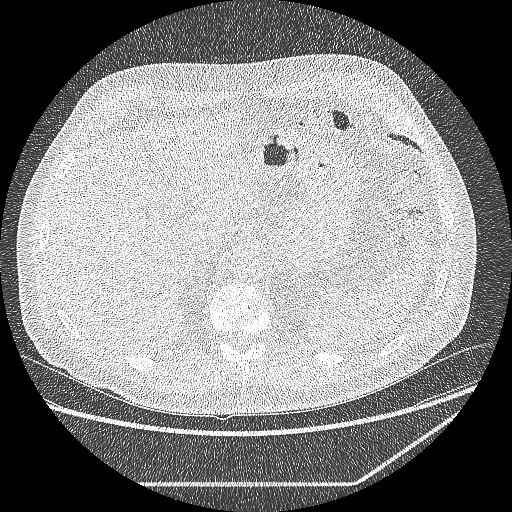
[im 50/261  lung]
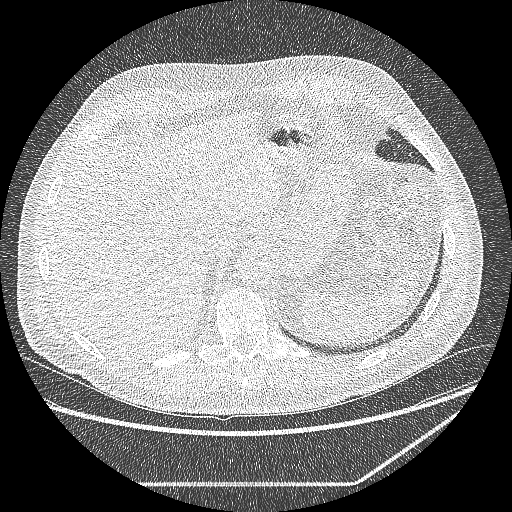
[im 62/261  lung]
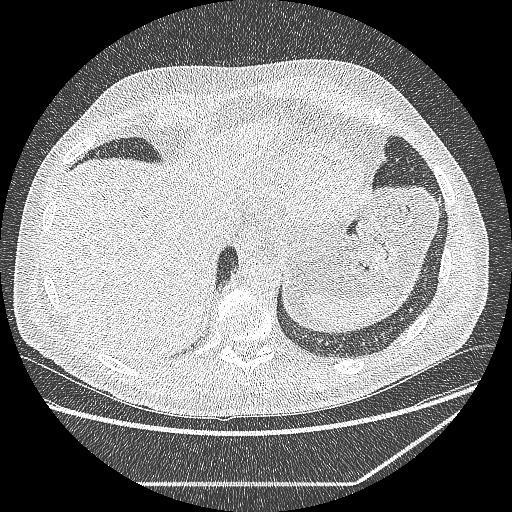
[im 87/261  mediastinal]
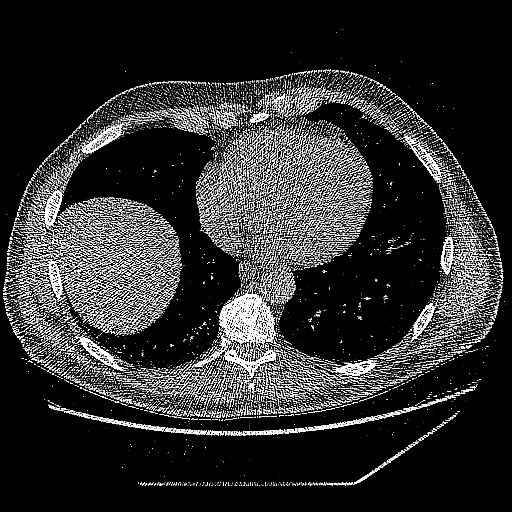
[im 87/261  lung]
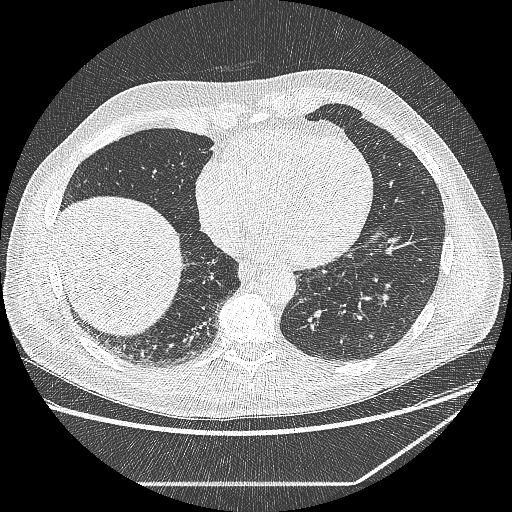
[im 100/261  lung]
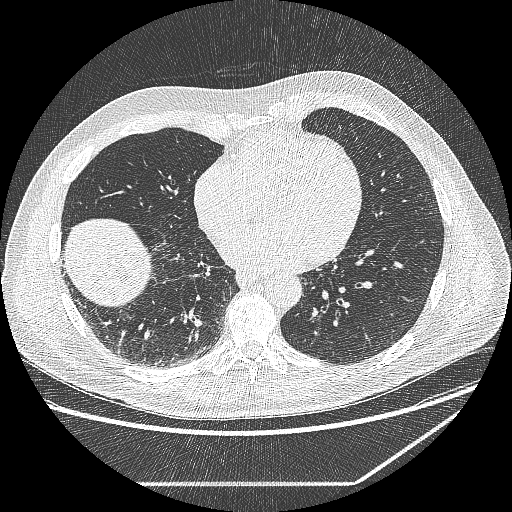
[im 112/261  lung]
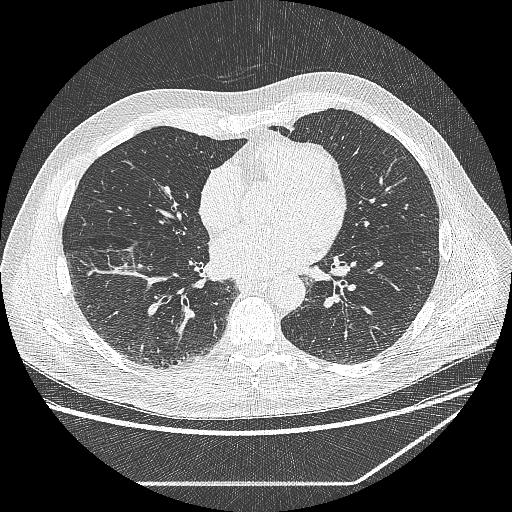
[im 137/261  lung]
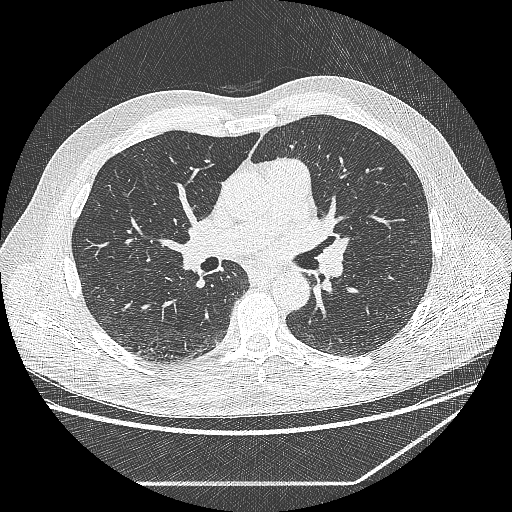
[im 149/261  mediastinal]
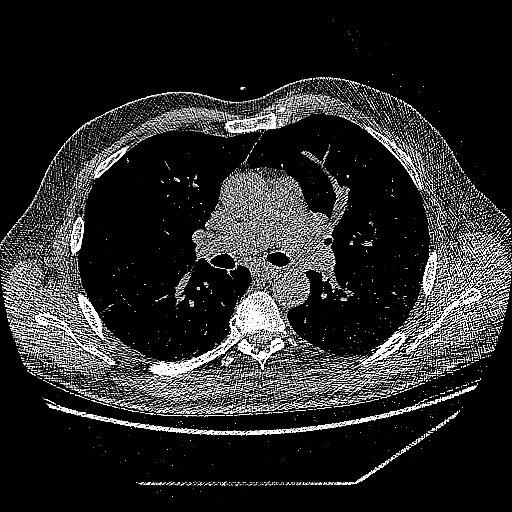
[im 149/261  lung]
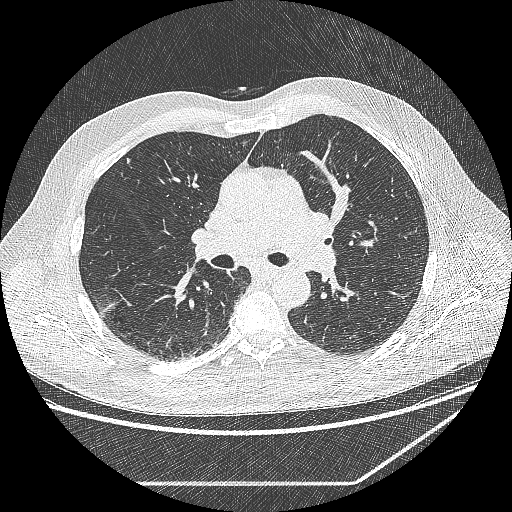
[im 161/261  lung]
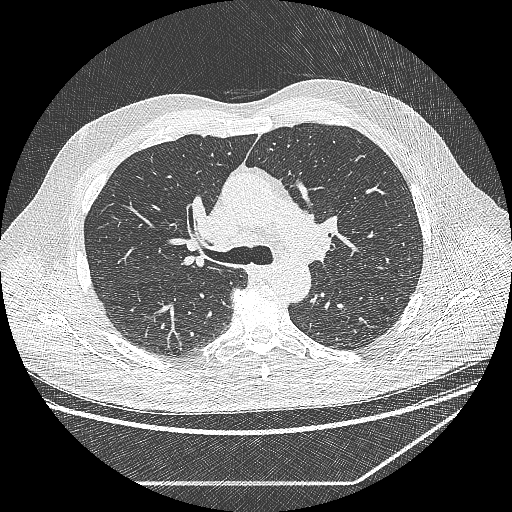
[im 186/261  lung]
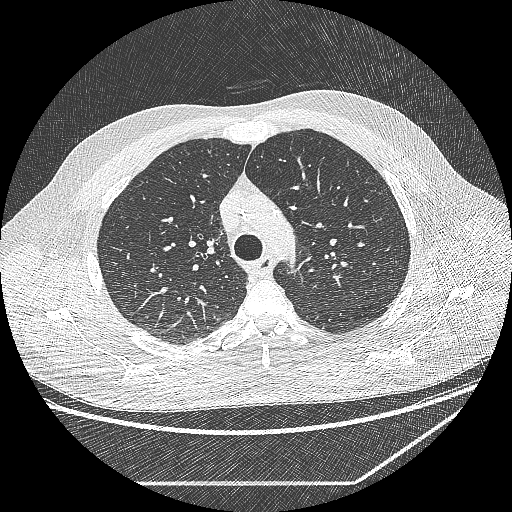
[im 199/261  lung]
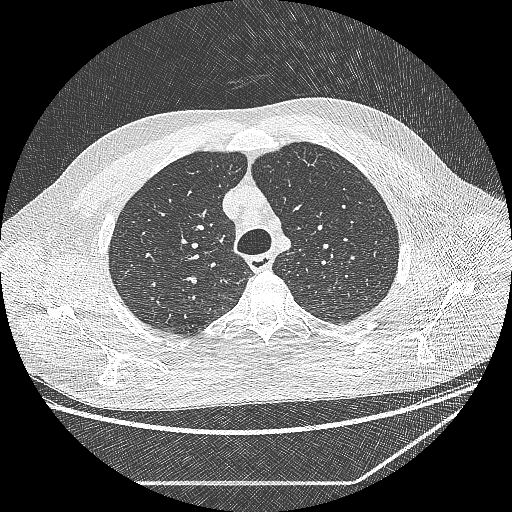
[im 211/261  mediastinal]
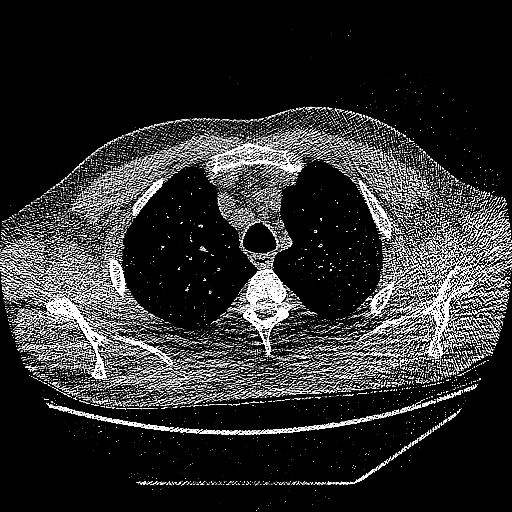
[im 211/261  lung]
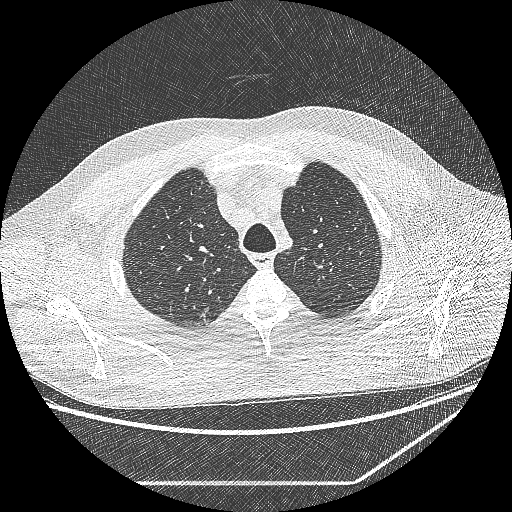
[im 236/261  lung]
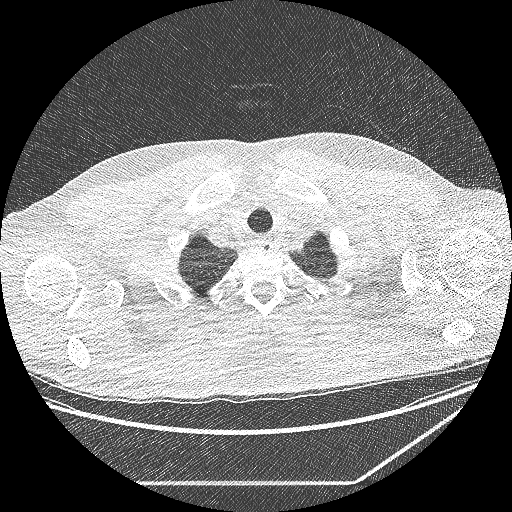
[im 248/261  lung]
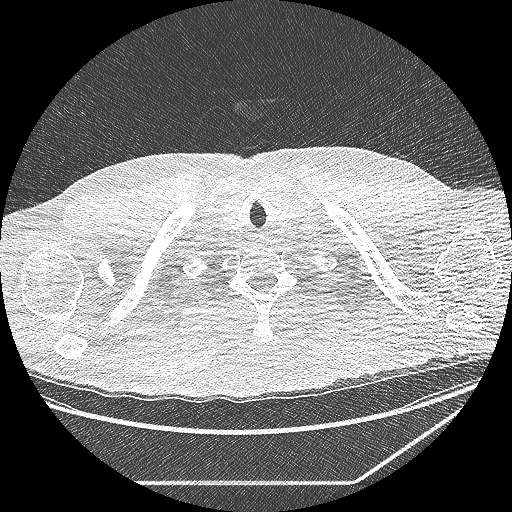

[15 of 40 positions shown; findings below may reference images not displayed]

FINDINGS: Cardiovascular: The heart size is normal. No pericardial effusion.
Coronary artery calcification is evident. Atherosclerotic
calcification is noted in the wall of the thoracic aorta.

Mediastinum/Nodes: No mediastinal lymphadenopathy. No evidence for
gross hilar lymphadenopathy although assessment is limited by the
lack of intravenous contrast on today's study. The esophagus has
normal imaging features. There is no axillary lymphadenopathy.

Lungs/Pleura: Tiny bilateral pulmonary nodules are again noted
without suspicious pulmonary nodule or mass. No focal airspace
consolidation. No pulmonary edema or pleural effusion. Centrilobular
emphysema noted.

Upper Abdomen: Unremarkable.

Musculoskeletal: Bone windows reveal no worrisome lytic or sclerotic
osseous lesions.
IMPRESSION: 1. Lung-RADS 2, benign appearance or behavior. Continue annual
screening with low-dose chest CT without contrast in 12 months.
2. Coronary artery and Aortic Atherosclerois (K1FZY-170.0)
3.  Emphysema. (K1FZY-H5P.6)

## 2019-01-04 DIAGNOSIS — A6000 Herpesviral infection of urogenital system, unspecified: Secondary | ICD-10-CM | POA: Diagnosis not present

## 2019-04-22 DIAGNOSIS — Z872 Personal history of diseases of the skin and subcutaneous tissue: Secondary | ICD-10-CM | POA: Diagnosis not present

## 2019-04-22 DIAGNOSIS — L814 Other melanin hyperpigmentation: Secondary | ICD-10-CM | POA: Diagnosis not present

## 2019-04-22 DIAGNOSIS — D225 Melanocytic nevi of trunk: Secondary | ICD-10-CM | POA: Diagnosis not present

## 2019-04-22 DIAGNOSIS — Z09 Encounter for follow-up examination after completed treatment for conditions other than malignant neoplasm: Secondary | ICD-10-CM | POA: Diagnosis not present

## 2019-04-22 DIAGNOSIS — L821 Other seborrheic keratosis: Secondary | ICD-10-CM | POA: Diagnosis not present

## 2019-05-09 DIAGNOSIS — E782 Mixed hyperlipidemia: Secondary | ICD-10-CM | POA: Diagnosis not present

## 2019-05-09 DIAGNOSIS — Z760 Encounter for issue of repeat prescription: Secondary | ICD-10-CM | POA: Diagnosis not present

## 2019-05-09 DIAGNOSIS — Z23 Encounter for immunization: Secondary | ICD-10-CM | POA: Diagnosis not present

## 2019-05-15 DIAGNOSIS — H25813 Combined forms of age-related cataract, bilateral: Secondary | ICD-10-CM | POA: Diagnosis not present

## 2019-05-15 DIAGNOSIS — H35362 Drusen (degenerative) of macula, left eye: Secondary | ICD-10-CM | POA: Diagnosis not present

## 2019-08-21 DIAGNOSIS — Z7689 Persons encountering health services in other specified circumstances: Secondary | ICD-10-CM | POA: Diagnosis not present

## 2019-08-21 DIAGNOSIS — D497 Neoplasm of unspecified behavior of endocrine glands and other parts of nervous system: Secondary | ICD-10-CM | POA: Diagnosis not present

## 2019-08-21 DIAGNOSIS — E7800 Pure hypercholesterolemia, unspecified: Secondary | ICD-10-CM | POA: Diagnosis not present

## 2019-08-21 DIAGNOSIS — B009 Herpesviral infection, unspecified: Secondary | ICD-10-CM | POA: Diagnosis not present

## 2019-08-21 DIAGNOSIS — Z91030 Bee allergy status: Secondary | ICD-10-CM | POA: Diagnosis not present

## 2019-08-21 DIAGNOSIS — M654 Radial styloid tenosynovitis [de Quervain]: Secondary | ICD-10-CM | POA: Diagnosis not present

## 2019-08-21 DIAGNOSIS — Z125 Encounter for screening for malignant neoplasm of prostate: Secondary | ICD-10-CM | POA: Diagnosis not present

## 2019-08-21 DIAGNOSIS — Z86010 Personal history of colonic polyps: Secondary | ICD-10-CM | POA: Diagnosis not present

## 2019-08-21 DIAGNOSIS — J449 Chronic obstructive pulmonary disease, unspecified: Secondary | ICD-10-CM | POA: Diagnosis not present

## 2019-08-21 DIAGNOSIS — Z87891 Personal history of nicotine dependence: Secondary | ICD-10-CM | POA: Diagnosis not present

## 2019-09-01 DIAGNOSIS — Z23 Encounter for immunization: Secondary | ICD-10-CM | POA: Diagnosis not present

## 2019-10-29 DIAGNOSIS — Z122 Encounter for screening for malignant neoplasm of respiratory organs: Secondary | ICD-10-CM | POA: Diagnosis not present

## 2019-10-29 DIAGNOSIS — Z87891 Personal history of nicotine dependence: Secondary | ICD-10-CM | POA: Diagnosis not present

## 2019-10-29 DIAGNOSIS — R911 Solitary pulmonary nodule: Secondary | ICD-10-CM | POA: Diagnosis not present
